# Patient Record
Sex: Male | Born: 1990 | Race: White | Hispanic: No | Marital: Single | Smoking: Former smoker
Health system: Southern US, Community
[De-identification: ages and names within clinical notes are randomized; demographics above are authoritative.]

## PROBLEM LIST (undated history)

## (undated) DIAGNOSIS — F329 Major depressive disorder, single episode, unspecified: Secondary | ICD-10-CM

## (undated) DIAGNOSIS — H905 Unspecified sensorineural hearing loss: Secondary | ICD-10-CM

## (undated) DIAGNOSIS — F419 Anxiety disorder, unspecified: Secondary | ICD-10-CM

## (undated) DIAGNOSIS — F32A Depression, unspecified: Secondary | ICD-10-CM

## (undated) DIAGNOSIS — F429 Obsessive-compulsive disorder, unspecified: Secondary | ICD-10-CM

## (undated) HISTORY — PX: EYE SURGERY: SHX253

## (undated) HISTORY — DX: Major depressive disorder, single episode, unspecified: F32.9

## (undated) HISTORY — DX: Obsessive-compulsive disorder, unspecified: F42.9

## (undated) HISTORY — DX: Anxiety disorder, unspecified: F41.9

## (undated) HISTORY — PX: BRAIN SURGERY: SHX531

## (undated) HISTORY — DX: Unspecified sensorineural hearing loss: H90.5

## (undated) HISTORY — DX: Depression, unspecified: F32.A

---
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
## 2017-04-07 ENCOUNTER — Ambulatory Visit (INDEPENDENT_AMBULATORY_CARE_PROVIDER_SITE_OTHER): Payer: BC Managed Care – PPO | Admitting: Orthopedic Surgery

## 2017-04-07 ENCOUNTER — Encounter (INDEPENDENT_AMBULATORY_CARE_PROVIDER_SITE_OTHER): Payer: Self-pay | Admitting: Family

## 2017-04-07 ENCOUNTER — Ambulatory Visit (INDEPENDENT_AMBULATORY_CARE_PROVIDER_SITE_OTHER): Payer: BC Managed Care – PPO

## 2017-04-07 VITALS — Ht 70.0 in | Wt 175.0 lb

## 2017-04-07 DIAGNOSIS — M7542 Impingement syndrome of left shoulder: Secondary | ICD-10-CM | POA: Insufficient documentation

## 2017-04-07 DIAGNOSIS — M542 Cervicalgia: Secondary | ICD-10-CM | POA: Insufficient documentation

## 2017-04-07 MED ORDER — METHYLPREDNISOLONE ACETATE 40 MG/ML IJ SUSP
40.0000 mg | INTRAMUSCULAR | Status: AC | PRN
  Administered 2017-04-07: 40 mg via INTRA_ARTICULAR

## 2017-04-07 MED ORDER — LIDOCAINE HCL 1 % IJ SOLN
5.0000 mL | INTRAMUSCULAR | Status: AC | PRN
  Administered 2017-04-07: 5 mL

## 2017-04-07 NOTE — Progress Notes (Signed)
Office Visit Note   Patient: Alexander SpiesDaniel P Hayes           Date of Birth: 10-01-1991           MRN: [id number] Visit Date: 04/07/2017              Requested by: Daisy Florooss, Charles Alan, MD [address] [place], [place] [address] PCP: Daisy Florooss, Charles Alan, MD  Chief Complaint  Patient presents with  . Neck - Pain      HPI: Patient is a 26 year old gentleman who presents with recurrent left shoulder pain. He states about 3-4 years ago he was told he had impingement syndrome he did physical therapy and his shoulder got better he had taken anti-inflammatories as well. Patient states at this time he resumed his physical therapy which has made the pain worse he states he has decreased range of motion the shoulder and decreased ability to sleep he states he's got left-sided neck pain with numbness and tingling down into his hand. He states he may have decreased grip strength..  Assessment & Plan: Visit Diagnoses:  1. Neck pain   2. Impingement syndrome of left shoulder     Plan: Patient's subacromial space of left shoulder was injected he tolerated this well follow-up in 4 weeks. Discussed that he does have some radicular symptoms but most of his symptoms seem to be coming from impingement of the shoulder. We'll reevaluate at follow-up. Patient cannot obtain an MRI scan due to his cochlear implant. May need a CT scan of the shoulder at follow-up if not better.  Follow-Up Instructions: Return in about 4 weeks (around 05/05/2017).   Ortho Exam  Patient is alert, oriented, no adenopathy, well-dressed, normal affect, normal respiratory effort. Examination patient has a normal gait. He has full range of motion left shoulder he has pain with Neer and Hawkins impingement test pain with drop arm test the biceps tendon is tender to palpation. The thoracic outlet is nontender to palpation that he does have some tenderness to palpation along the medial scapular border. There is no focal motor weakness in  either upper extremity. No pain with rotation of the cervical spine.  Imaging: Xr Cervical Spine 2 Or 3 Views  Result Date: 04/07/2017 Two-view radiographs of the cervical spine shows some small osteophytic bone spurs C5-6 otherwise the joint spaces are well maintained.   Labs: No results found for: HGBA1C, ESRSEDRATE, CRP, LABURIC, REPTSTATUS, GRAMSTAIN, CULT, LABORGA  Orders:  Orders Placed This Encounter  Procedures  . Large Joint Injection/Arthrocentesis  . XR Cervical Spine 2 or 3 views   No orders of the defined types were placed in this encounter.    Procedures: Large Joint Inj Date/Time: 04/07/2017 10:18 AM Performed by: Yishai Rehfeld V Authorized by: Nadara MustardUDA, Maiyah Goyne V   Consent Given by:  Patient Site marked: the procedure site was marked   Timeout: prior to procedure the correct patient, procedure, and site was verified   Indications:  Pain and diagnostic evaluation Location:  Shoulder Site:  L subacromial bursa Prep: patient was prepped and draped in usual sterile fashion   Needle Size:  22 G Needle Length:  1.5 inches Approach:  Posterior Ultrasound Guidance: No   Fluoroscopic Guidance: No   Arthrogram: No   Medications:  5 mL lidocaine 1 %; 40 mg methylPREDNISolone acetate 40 MG/ML Aspiration Attempted: No   Patient tolerance:  Patient tolerated the procedure well with no immediate complications    Clinical Data: No additional findings.  ROS:  All other systems negative, except as noted in the HPI. Review of Systems  Objective: Vital Signs: Ht 5\' 10"  (1.778 m)   Wt 175 lb (79.4 kg)   BMI 25.11 kg/m   Specialty Comments:  No specialty comments available.  PMFS History: Patient Active Problem List   Diagnosis Date Noted  . Impingement syndrome of left shoulder 04/07/2017  . Neck pain 04/07/2017   No past medical history on file.  No family history on file.  No past surgical history on file. Social History   Occupational History  . Not on  file.   Social History Main Topics  . Smoking status: Never Smoker  . Smokeless tobacco: Never Used  . Alcohol use No  . Drug use: No  . Sexual activity: Not on file

## 2017-05-05 ENCOUNTER — Telehealth (INDEPENDENT_AMBULATORY_CARE_PROVIDER_SITE_OTHER): Payer: Self-pay | Admitting: Family

## 2017-05-05 ENCOUNTER — Ambulatory Visit (INDEPENDENT_AMBULATORY_CARE_PROVIDER_SITE_OTHER): Payer: BC Managed Care – PPO | Admitting: Family

## 2017-05-05 ENCOUNTER — Ambulatory Visit
Admission: RE | Admit: 2017-05-05 | Discharge: 2017-05-05 | Disposition: A | Discharge status: Home / Self Care | Payer: BC Managed Care – PPO | Source: Ambulatory Visit | Attending: Family | Admitting: Family

## 2017-05-05 DIAGNOSIS — M7542 Impingement syndrome of left shoulder: Secondary | ICD-10-CM

## 2017-05-05 NOTE — Progress Notes (Signed)
   Office Visit Note   Patient: Alexander Hayes           Date of Birth: 03-Oct-1991           MRN: [id number] Visit Date: 05/05/2017              Requested by: Daisy Florooss, Charles Alan, MD [address] [place], [place] [address] PCP: Daisy Florooss, Charles Alan, MD  No chief complaint on file.     HPI: Patient is a 26 year old gentleman who presents with recurrent left shoulder pain. He states about 3-4 years ago he was told he had impingement syndrome he did physical therapy and his shoulder got better he had taken anti-inflammatories as well. Patient states 4 weeks ago had resumed his physical therapy which has made the pain worse he states he has decreased range of motion the shoulder and decreased ability to sleep. No neck pain today. Does have some numbness in upper arm. No tingling today.   Did have subacromial depomedrol injection 4 weeks ago. Has noticed no relief with this injection.   Assessment & Plan: Visit Diagnoses:  1. Impingement syndrome of left shoulder     Plan: Will proceed with a CT scan of the shoulder. Follow up for review of CT. Discussed possibility of arthroscopy.   Follow-Up Instructions: Return for ct review.   Ortho Exam  Patient is alert, oriented, no adenopathy, well-dressed, normal affect, normal respiratory effort. Examination patient has a normal gait. He has full range of motion left shoulder he has pain with Neer and Hawkins impingement test. pain with drop arm test. the biceps tendon is tender to palpation. he does have some tenderness to palpation along the medial scapular border. There is no focal motor weakness in either upper extremity. No pain with rotation of the cervical spine.  Imaging: No results found.  Labs: No results found for: HGBA1C, ESRSEDRATE, CRP, LABURIC, REPTSTATUS, GRAMSTAIN, CULT, LABORGA  Orders:  Orders Placed This Encounter  Procedures  . CT SHOULDER LEFT WO CONTRAST   No orders of the defined types were placed in this  encounter.    Procedures: No procedures performed  Clinical Data: No additional findings.  ROS:  All other systems negative, except as noted in the HPI. Review of Systems  Constitutional: Negative for chills and fever.  Musculoskeletal: Positive for arthralgias.    Objective: Vital Signs: There were no vitals taken for this visit.  Specialty Comments:  No specialty comments available.  PMFS History: Patient Active Problem List   Diagnosis Date Noted  . Impingement syndrome of left shoulder 04/07/2017  . Neck pain 04/07/2017   No past medical history on file.  No family history on file.  No past surgical history on file. Social History   Occupational History  . Not on file.   Social History Main Topics  . Smoking status: Never Smoker  . Smokeless tobacco: Never Used  . Alcohol use No  . Drug use: No  . Sexual activity: Not on file

## 2017-05-05 NOTE — Telephone Encounter (Signed)
error 

## 2017-05-09 ENCOUNTER — Telehealth (INDEPENDENT_AMBULATORY_CARE_PROVIDER_SITE_OTHER): Payer: Self-pay | Admitting: Family

## 2017-05-10 ENCOUNTER — Telehealth (INDEPENDENT_AMBULATORY_CARE_PROVIDER_SITE_OTHER): Payer: Self-pay | Admitting: Orthopedic Surgery

## 2017-05-10 NOTE — Telephone Encounter (Signed)
Patient would be out of work for 2 weeks after surgery and then light duty work with no lifting greater than 5 pounds for 2 more weeks.

## 2017-05-10 NOTE — Telephone Encounter (Signed)
WHILE SCHEDULING PT FOR SURGERY HE HAD A QUESTION OF HOW LONG TO EXPECT TO BE OUT OF WORK SO THAT HE CAN PUT THE REQUEST IN TO HIS JOB. PLEASE GIVE PT A CALL.

## 2017-05-11 NOTE — Telephone Encounter (Signed)
I called and spoke with patient to advise of message below. He is actually wanting to cancel on 26th. He feels he is making improvement.

## 2017-05-30 ENCOUNTER — Inpatient Hospital Stay (INDEPENDENT_AMBULATORY_CARE_PROVIDER_SITE_OTHER): Payer: BC Managed Care – PPO | Admitting: Orthopedic Surgery

## 2017-07-13 NOTE — Telephone Encounter (Signed)
ERROR

## 2018-05-01 IMAGING — CT CT SHOULDER*L* W/O CM
2 series · 15 of 20 positions shown, 18 images · non-contrast
Comparison: None.

CLINICAL DATA: Left shoulder pain, limited range of motion for 3
years.

EXAM:
CT OF THE UPPER LEFT EXTREMITY WITHOUT CONTRAST
TECHNIQUE: Multidetector CT imaging of the upper left extremity was performed
according to the standard protocol.

[Series 2: soft tissue · axial · 0.49mm/px · z∈[-616,-450]mm · 12 of 99 slices shown, 15 images]
[im 8/99  soft-tissue]
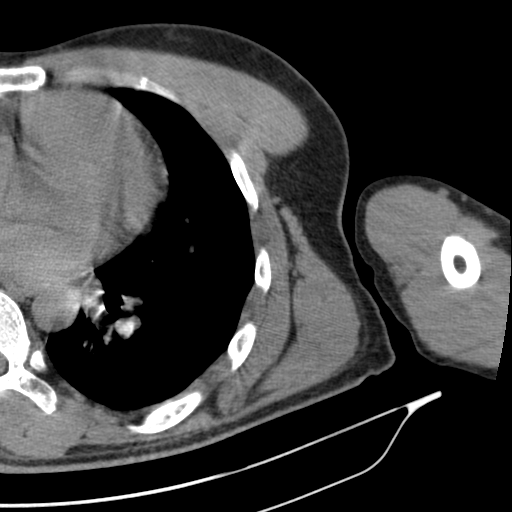
[im 8/99  bone]
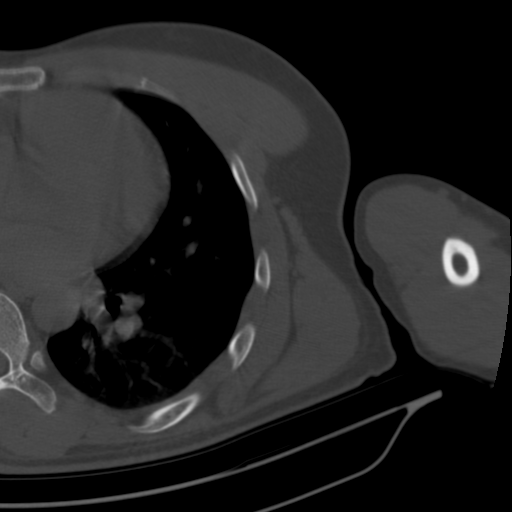
[im 16/99  bone]
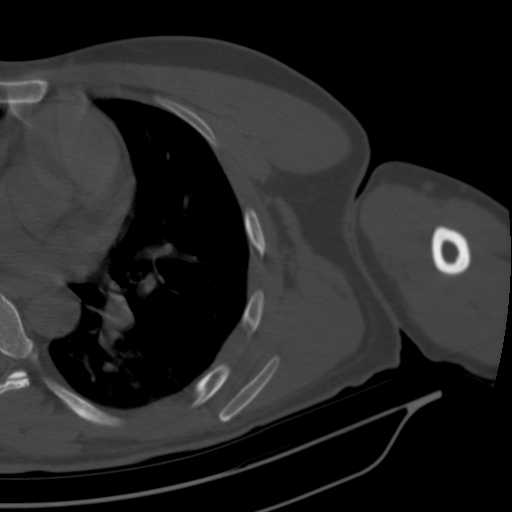
[im 23/99  bone]
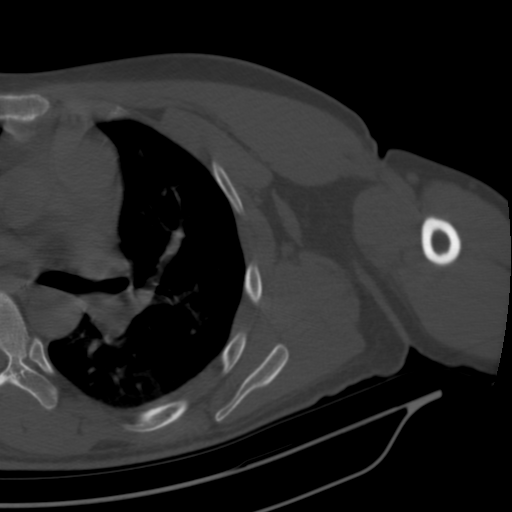
[im 31/99  bone]
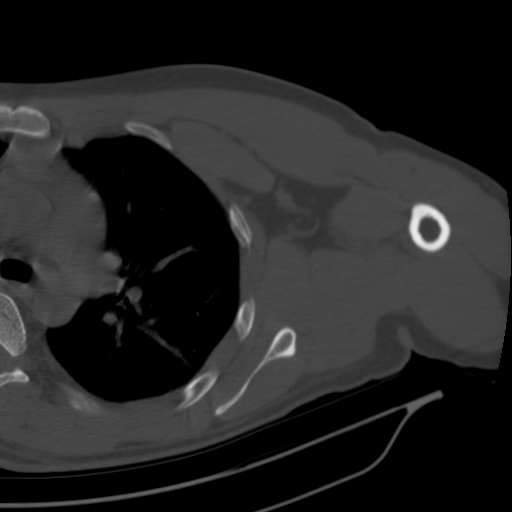
[im 38/99  soft-tissue]
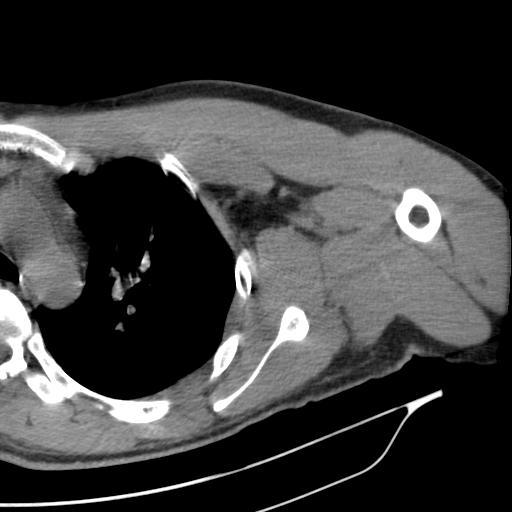
[im 38/99  bone]
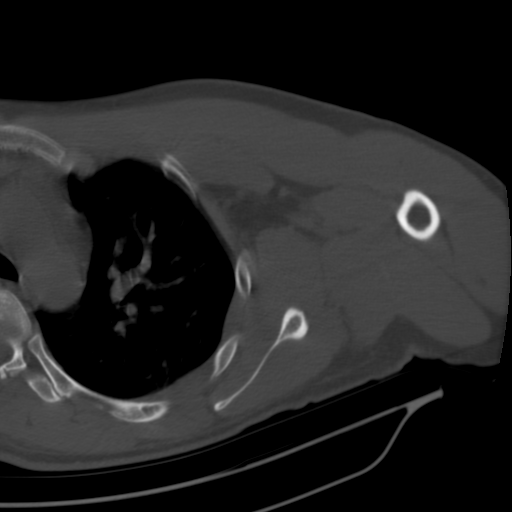
[im 46/99  bone]
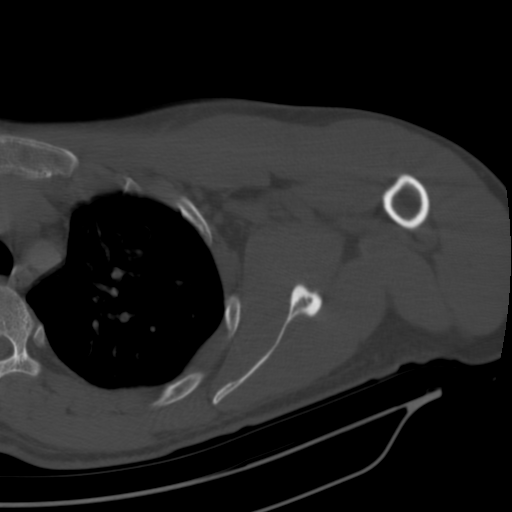
[im 53/99  bone]
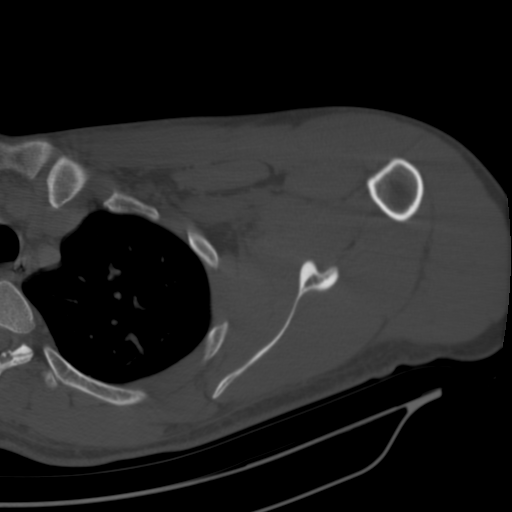
[im 61/99  bone]
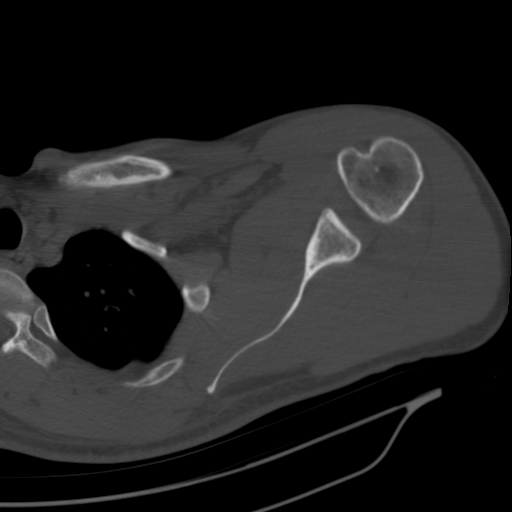
[im 68/99  soft-tissue]
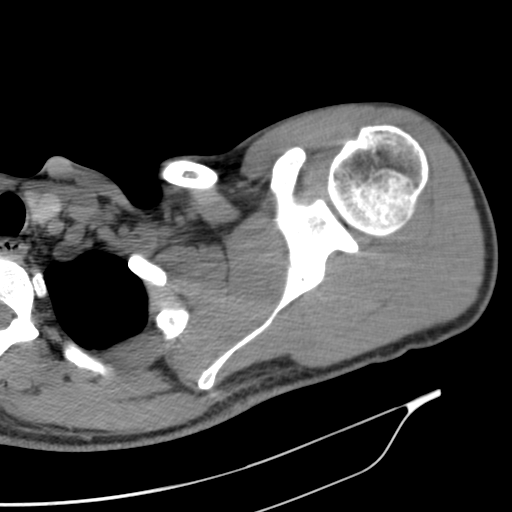
[im 68/99  bone]
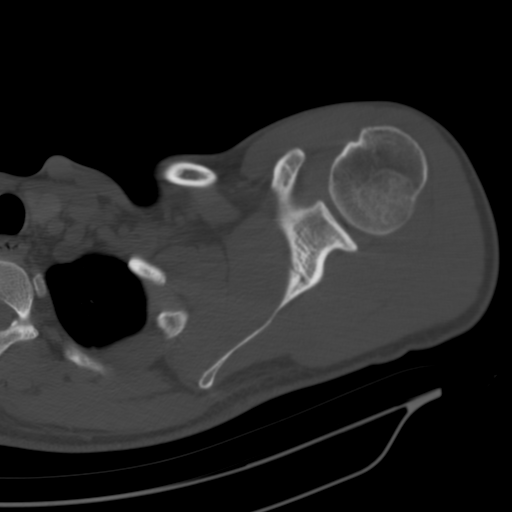
[im 76/99  bone]
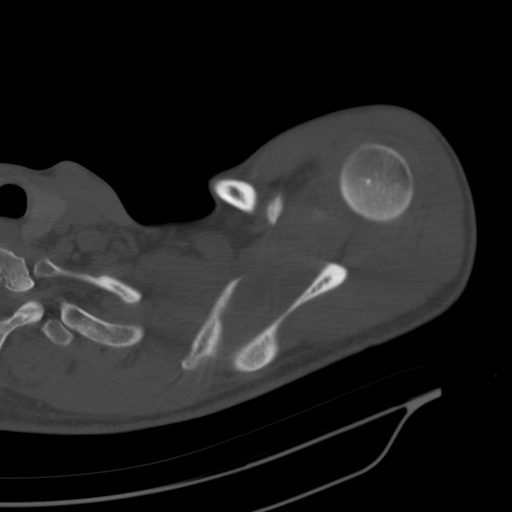
[im 83/99  bone]
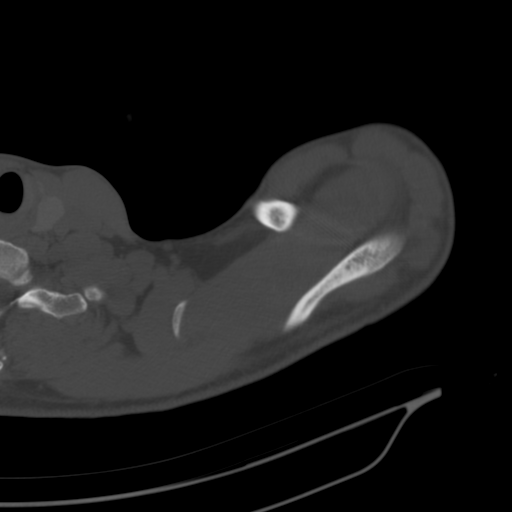
[im 91/99  bone]
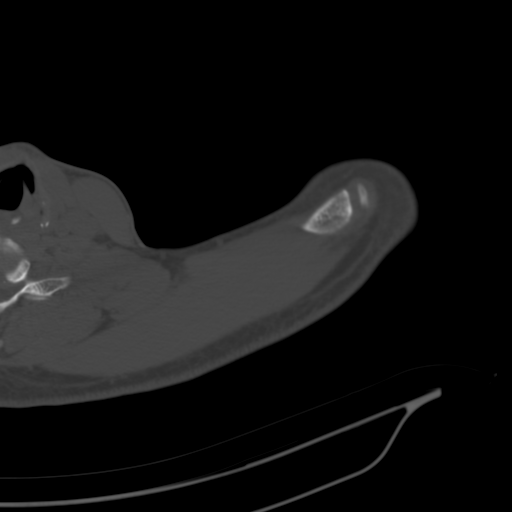

[Series 7: cor soft · coronal · 0.41mm/px · 3 of 87 slices shown]
[im 18/87  bone]
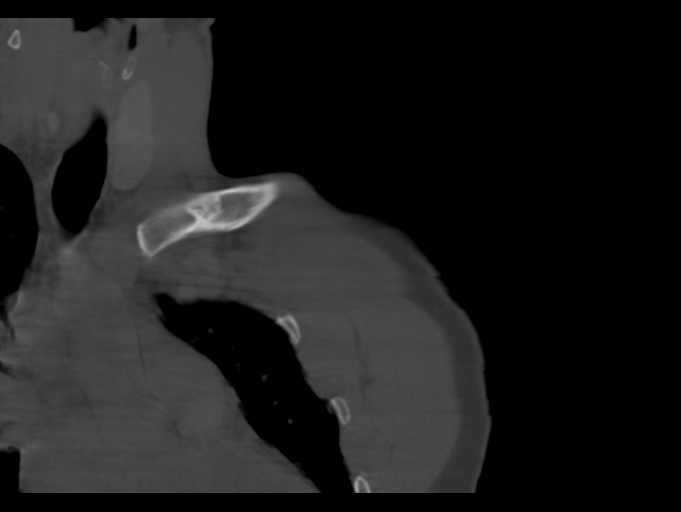
[im 35/87  bone]
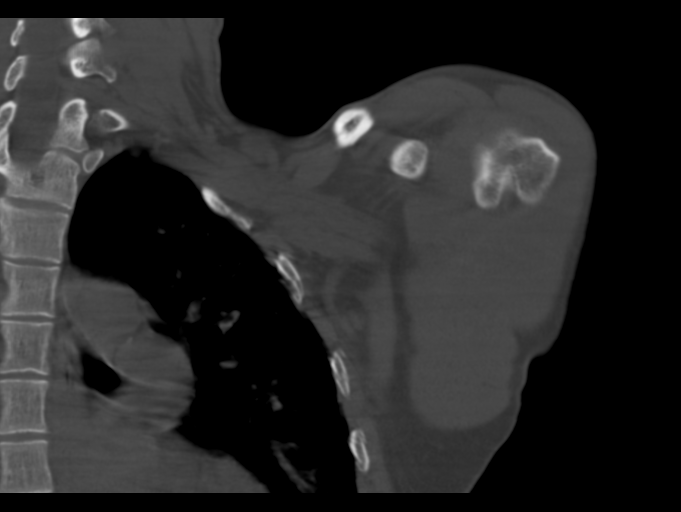
[im 52/87  bone]
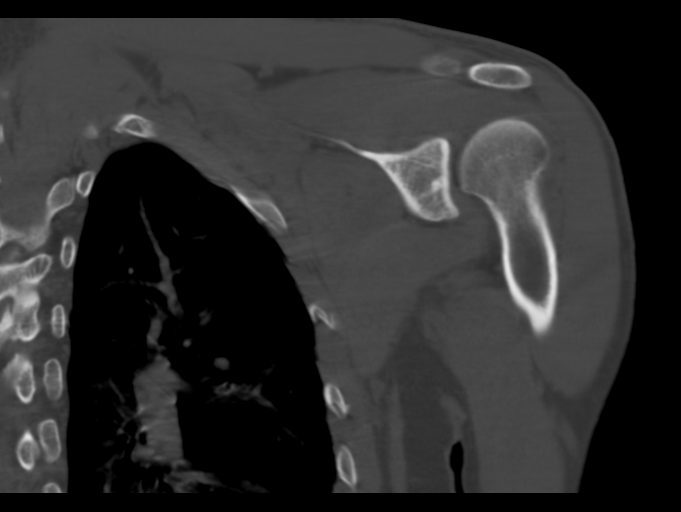

[15 of 20 positions shown; findings below may reference images not displayed]

FINDINGS: Bones/Joint/Cartilage

No fracture or dislocation. Normal alignment. No joint effusion.
Normal glenohumeral joint. Normal acromioclavicular joint.

Small sclerotic bone lesion in the glenoid consistent with a bone
island. No aggressive lytic or sclerotic osseous lesion.

Ligaments

Ligaments are suboptimally evaluated by CT.

Muscles and Tendons
Muscles are normal.  No muscle atrophy.

Soft tissue
No fluid collection or hematoma.  No soft tissue mass.
IMPRESSION: No acute osseous injury of the left shoulder. Evaluation of the
rotator cuff is limited on this examination given the lack of
intra-articular contrast. If there is persisting concern regarding a
rotator cuff tear, recommend an MRI of the shoulder or CT arthrogram
of the shoulder if the patient is unable to have an MRI.

## 2018-08-28 ENCOUNTER — Ambulatory Visit (INDEPENDENT_AMBULATORY_CARE_PROVIDER_SITE_OTHER): Payer: BC Managed Care – PPO | Admitting: Psychiatry

## 2018-08-28 DIAGNOSIS — F422 Mixed obsessional thoughts and acts: Secondary | ICD-10-CM | POA: Diagnosis not present

## 2018-08-28 DIAGNOSIS — F331 Major depressive disorder, recurrent, moderate: Secondary | ICD-10-CM | POA: Diagnosis not present

## 2018-08-28 DIAGNOSIS — F411 Generalized anxiety disorder: Secondary | ICD-10-CM

## 2018-08-28 NOTE — Progress Notes (Signed)
[facility] Med Check  Patient ID: Alexander Hayes,  MRN: [id number]  PCP: Daisy Floro, MD  Date of Evaluation: 08/30/2018 Time spent:20 minutes   HISTORY/CURRENT STATUS: 27 yowmale seen 1 months ago.better and increased cymbalta to 90 mg day. SI and SP in past. Declined use of lithium  Currently Patient is currently doing about the same. Individual Medical History/ Review of Systems: Changes? No   Allergies: Patient has no known allergies.  Current Medications:  Current Outpatient Medications:  .  DULoxetine (CYMBALTA) 30 MG capsule, Take 30 mg by mouth 3 (three) times daily. 3/d, Disp: , Rfl:  Medication Side Effects: None  Family Medical/ Social History: Changes? No  MENTAL HEALTH EXAM:  There were no vitals taken for this visit.There is no height or weight on file to calculate BMI.  General Appearance: Casual  Eye Contact:  Good  Speech:  Normal Rate  Volume:  Normal  Mood:  Euthymic  Affect:  Appropriate  Thought Process:  Coherent  Orientation:  Full (Time, Place, and Person)  Thought Content: Logical   Suicidal Thoughts:  No  Homicidal Thoughts:  No  Memory:  Immediate  Judgement:  Good  Insight:  Good  Psychomotor Activity:  Normal  Concentration:  Concentration: Good  Recall:  Good  Fund of Knowledge: Good  Language: Good  Akathisia:  NA  AIMS (if indicated): na   Assets:  Desire for Improvement  ADL's:  Intact  Cognition: WNL  Prognosis:  Good    DIAGNOSES:    ICD-10-CM   1. Major depressive disorder, recurrent episode, moderate (HCC) F33.1   2. Generalized anxiety disorder F41.1   3. Mixed obsessional thoughts and acts F42.2     RECOMMENDATIONS: No change in his treatment plan.  He is to return in 2 months.    Anne Fu, PA-C

## 2018-09-02 DIAGNOSIS — F411 Generalized anxiety disorder: Secondary | ICD-10-CM

## 2018-09-02 DIAGNOSIS — F341 Dysthymic disorder: Secondary | ICD-10-CM | POA: Insufficient documentation

## 2018-09-12 ENCOUNTER — Ambulatory Visit: Payer: Self-pay | Admitting: Psychiatry

## 2018-09-13 ENCOUNTER — Ambulatory Visit: Payer: Self-pay | Admitting: Psychiatry

## 2018-09-17 ENCOUNTER — Other Ambulatory Visit: Payer: Self-pay | Admitting: Psychiatry

## 2019-01-28 ENCOUNTER — Ambulatory Visit: Payer: BC Managed Care – PPO | Admitting: Physician Assistant

## 2019-02-19 ENCOUNTER — Ambulatory Visit: Payer: BC Managed Care – PPO | Admitting: Physician Assistant

## 2019-02-21 ENCOUNTER — Ambulatory Visit (INDEPENDENT_AMBULATORY_CARE_PROVIDER_SITE_OTHER): Payer: BC Managed Care – PPO | Admitting: Psychiatry

## 2019-02-21 ENCOUNTER — Encounter: Payer: Self-pay | Admitting: Psychiatry

## 2019-02-21 ENCOUNTER — Other Ambulatory Visit: Payer: Self-pay

## 2019-02-21 DIAGNOSIS — F411 Generalized anxiety disorder: Secondary | ICD-10-CM | POA: Diagnosis not present

## 2019-02-21 DIAGNOSIS — F341 Dysthymic disorder: Secondary | ICD-10-CM

## 2019-02-21 DIAGNOSIS — F422 Mixed obsessional thoughts and acts: Secondary | ICD-10-CM | POA: Diagnosis not present

## 2019-02-21 DIAGNOSIS — F429 Obsessive-compulsive disorder, unspecified: Secondary | ICD-10-CM | POA: Insufficient documentation

## 2019-02-21 MED ORDER — BUPROPION HCL ER (XL) 150 MG PO TB24: 150.0000 mg | ORAL_TABLET | Freq: Every day | ORAL | 1 refills | Status: DC

## 2019-02-21 MED ORDER — FLUVOXAMINE MALEATE 100 MG PO TABS: 100.0000 mg | ORAL_TABLET | Freq: Every day | ORAL | 1 refills | Status: DC
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
## 2019-02-28 ENCOUNTER — Telehealth: Payer: Self-pay | Admitting: Psychiatry

## 2019-02-28 DIAGNOSIS — F411 Generalized anxiety disorder: Secondary | ICD-10-CM

## 2019-02-28 DIAGNOSIS — F422 Mixed obsessional thoughts and acts: Secondary | ICD-10-CM

## 2019-02-28 DIAGNOSIS — F341 Dysthymic disorder: Secondary | ICD-10-CM

## 2019-02-28 NOTE — Telephone Encounter (Signed)
Patient called and said that he is having side effects from the medicine. He is having [place] hand tremors. He had to leave work early because he can't finish his work. Please call him and let him know what to do. His next appt is in may [phone]

## 2019-02-28 NOTE — Telephone Encounter (Signed)
Patient phones that he started Wellbutrin 150 XL in the morning then adding the Luvox at night at 100 mg nightly after which he seemed to develop nausea and hand tremor for which he left work today.  He reports being more anxious whether from the side effect or the absence of benefit, though acknowledging he is frustrated as he wanted more relief and instead finds problems.  He recalls that Zoloft caused no nausea whereas Luvox in the same family may be causing his nausea.  He agrees to stop the Luvox and assess symptom course for the next 1 to 2 days after which if symptoms persist he can stop the Wellbutrin for 1 to 2 days and then  contact me Monday morning to plan treatment according to the outcome of those adjustments.  He is less frustrated and more satisfied with the plan by the end of the discussion, though he requires 10 minutes of discussion to feel relief.

## 2019-03-05 MED ORDER — SERTRALINE HCL 100 MG PO TABS: 100.0000 mg | ORAL_TABLET | Freq: Every day | ORAL | 1 refills | Status: DC

## 2019-03-05 NOTE — Telephone Encounter (Signed)
Patient now phones that he has stopped Wellbutrin and Luvox considering them too potent for him causing side effects.  He recalls the Zoloft being his best medication in the past except for episodic diarrhea once a week.  We will restart Zoloft nightly last taken approximately 6 months ago not having discontinuation symptoms now.  Zoloft 100 mg every morning after breakfast #30 with 1 refill is [facility] on [address] and [address] to titrate up from 1/2 tablet over the first 10 days to 1 tablet thereafter.

## 2019-03-05 NOTE — Telephone Encounter (Signed)
Pt wishes to go back on Zoloft. He has stopped Luvox and Wellbutrin since Thursday.

## 2019-03-15 ENCOUNTER — Ambulatory Visit: Payer: BC Managed Care – PPO | Admitting: Physician Assistant

## 2019-03-27 ENCOUNTER — Other Ambulatory Visit: Payer: Self-pay | Admitting: Psychiatry

## 2019-03-27 DIAGNOSIS — F422 Mixed obsessional thoughts and acts: Secondary | ICD-10-CM

## 2019-03-27 DIAGNOSIS — F411 Generalized anxiety disorder: Secondary | ICD-10-CM

## 2019-03-27 DIAGNOSIS — F341 Dysthymic disorder: Secondary | ICD-10-CM

## 2019-04-04 ENCOUNTER — Encounter: Payer: Self-pay | Admitting: Psychiatry

## 2019-04-04 ENCOUNTER — Other Ambulatory Visit: Payer: Self-pay

## 2019-04-04 ENCOUNTER — Ambulatory Visit (INDEPENDENT_AMBULATORY_CARE_PROVIDER_SITE_OTHER): Payer: BC Managed Care – PPO | Admitting: Psychiatry

## 2019-04-04 DIAGNOSIS — F422 Mixed obsessional thoughts and acts: Secondary | ICD-10-CM | POA: Diagnosis not present

## 2019-04-04 DIAGNOSIS — F341 Dysthymic disorder: Secondary | ICD-10-CM | POA: Diagnosis not present

## 2019-04-04 DIAGNOSIS — F411 Generalized anxiety disorder: Secondary | ICD-10-CM

## 2019-04-04 MED ORDER — SERTRALINE HCL 100 MG PO TABS: 150.0000 mg | ORAL_TABLET | Freq: Every day | ORAL | 0 refills | Status: DC

## 2019-04-04 NOTE — Progress Notes (Signed)
[facility]  Patient ID: Alexander Hayes,  MRN: [id number]  PCP: Daisy Floro, MD  Date of Evaluation: 04/04/2019 Time spent:20 minutes from 1400 to 1420  Chief Complaint:  Chief Complaint    Depression; Anxiety; Paranoid      HISTORY/CURRENT STATUS: Alexander Hayes is provided telemedicine audiovisual appointment session, declining video camera as he is walking in the park distracted by the wind altering cochlear implants as relative resistance to session, with consent not collateral for psychiatric interview and exam in 6-week evaluation and management of dysthymia, OCD, and GAD comorbid with compensation for hearing impairment.  Patient sought restructured medication management last appointment with chief concern that providers had been too conservative after 6 months of therapy had been ineffective.  However he phoned 1 week after starting low-dose Wellbutrin and Luvox stating that he must change back to Zoloft of the past which other than diarrhea had few side effects.  He now gives the curious answer that he had mild side effects from Zoloft for a week and then these resolved when he did not extend similar opportunity with with the Luvox and Wellbutrin after previous lithium, Paxil, and Cymbalta.  He then surprisingly states he is looking for a new psychotherapist as though he will be less dependent on medication efficacy.  [name] reports having more physical and emotional energy with improved mood by these decisive changes, using modest alcohol and no THC or tobacco now. He has no psychosis, mania, suicidality, or dissociation..  Depression       The patient presents with depression.  This is a chronic problem.  The current episode started more than 1 year ago.   The onset quality is gradual.   The problem occurs every several days.  The problem has been gradually improving since onset.  Associated symptoms include decreased concentration, helplessness, hopelessness, insomnia,  decreased interest, indigestion and sad.  Associated symptoms include no fatigue, not irritable, no restlessness, no appetite change, no body aches, no myalgias, no headaches and no suicidal ideas.     The symptoms are aggravated by work stress, social issues, family issues and medication.  Past treatments include SSRIs - Selective serotonin reuptake inhibitors, SNRIs - Serotonin and norepinephrine reuptake inhibitors, other medications and psychotherapy.  Compliance with treatment is variable.  Past compliance problems include difficulty with treatment plan, medication issues and medical issues.  Previous treatment provided mild relief.  Risk factors include a change in medication usage/dosage, alcohol intake, family history of mental illness, family history, history of mental illness, major life event, prior psychiatric admission, stress and prior traumatic experience.   Past medical history includes chronic illness, physical disability, brain trauma, anxiety, depression, mental health disorder and obsessive-compulsive disorder.     Pertinent negatives include no life-threatening condition, no recent psychiatric admission, no bipolar disorder, no eating disorder, no post-traumatic stress disorder, no schizophrenia, no suicide attempts and no head trauma.   Individual Medical History/ Review of Systems: Changes? :No   Allergies: Patient has no known allergies.  Current Medications:  Current Outpatient Medications:  .  sertraline (ZOLOFT) 100 MG tablet, Take 1.5 tablets (150 mg total) by mouth daily., Disp: 135 tablet, Rfl: 0   Medication Side Effects: none after 1 week of mild GI and craniofacial dysesthesia from Zoloft that resolved when he expected diarrhea from the past but had none.  He has hand tremor and nausea from Luvox and anxiety from Wellbutrin.  Family Medical/ Social History: Changes? Yes, modest alcohol but no THC or tobacco  now.  MENTAL HEALTH EXAM:  There were no vitals taken for  this visit.There is no height or weight on file to calculate BMI.  as not present here today  General Appearance: N/A  Eye Contact:  N/A  Speech:  Garbled and Talkative  Volume:  Increased  Mood:  Anxious, Dysphoric, Euthymic and Irritable  Affect:  Constricted, Inappropriate and Anxious  Thought Process:  Goal Directed, Irrelevant and Linear  Orientation:  Full (Time, Place, and Person)  Thought Content: Obsessions, Paranoid Ideation and Rumination   Suicidal Thoughts:  No  Homicidal Thoughts:  No  Memory:  Immediate;   Good Remote;   Fair  Judgement:  Impaired  Insight:  Fair and Lacking  Psychomotor Activity:  Increased, Decreased and Mannerisms  Concentration:  Concentration: Fair and Attention Span: Fair  Recall:  FiservFair  Fund of Knowledge: Fair  Language: Poor  Assets:  Desire for Improvement Leisure Time Talents/Skills  ADL's:  Intact  Cognition: WNL  Prognosis:  Fair    DIAGNOSES:    ICD-10-CM   1. Persistent depressive disorder with anxious distress, currently moderate F34.1 sertraline (ZOLOFT) 100 MG tablet  2. Mixed obsessional thoughts and acts F42.2 sertraline (ZOLOFT) 100 MG tablet  3. Generalized anxiety disorder F41.1 sertraline (ZOLOFT) 100 MG tablet    Receiving Psychotherapy: Yes Terminated with Angela Burkeawyna Thompson, LPC for 6 months in 2016 considering starting again looking for a therapist reporting specific options.   RECOMMENDATIONS: Over 50% of the session is spent in counseling and coordination of care attempting to secure for patient problem solving style that does not become circular or self-defeating but can build stepwise solutions for improved function.  Patient can continue this work in therapy if he starts.  At this time he gradually acknowledges that he has only partial benefit from 100 mg of Zoloft daily and agrees that absence of any side effects now favors increase especially for his OCD to dose 1-1/2 of the 100 mg tablets total 150 mg sent as  90-day supply #135 [facility] [address] with no refill for OCD, dysthymia and GAD.  He returns for follow up in 4 weeks, but he may possibly require low-dose Trilafon or BuSpar to sustan improvement.  Virtual Visit via Video Note  I connected with Melton AlarDaniel P Sheeley on 04/04/19 at  2:00 PM EDT by a video enabled telemedicine application and verified that I am speaking with the correct person using two identifiers.  Location: Patient: Individually walking in the park instead of staying home for his appointment Provider: [facility]   I discussed the limitations of evaluation and management by telemedicine and the availability of in person appointments. The patient expressed understanding and agreed to proceed.  History of Present Illness: 6-week evaluation and management address dysthymia, OCD, and GAD comorbid with compensation for hearing impairment.  Patient sought restructured medication management last appointment with chief concern that providers had been too conservative after 6 months of therapy had been ineffective.  However he phoned 1 week after starting low-dose Wellbutrin and Luvox stating that he must change back to Zoloft of the past which other than diarrhea had few side effects.    Observations/Objective: Mood:  Anxious, Dysphoric, Euthymic and Irritable  Affect:  Constricted, Inappropriate and Anxious  Thought Process:  Goal Directed, Irrelevant and Linear  Orientation:  Full (Time, Place, and Person)  Thought Content: Obsessions, Paranoid Ideation and Rumination    Assessment and Plan: Over 50% of the session is spent  in counseling and coordination of care attempting to secure for patient problem solving style that does not become circular or self-defeating but can build stepwise solutions for improved function.  Patient can continue this work in therapy if he starts.  At this time he gradually acknowledges that he has only partial benefit from  100 mg of Zoloft daily and agrees that absence of any side effects now favors increase especially for his OCD to dose 1-1/2 of the 100 mg tablets total 150 mg sent as 90-day supply #135 [facility] [address] with no refill for OCD, dysthymia and GAD.  Follow Up Instructions: He returns for follow up in 4 weeks, but he may possibly require low-dose Trilafon or BuSpar to sustan improvement.    I discussed the assessment and treatment plan with the patient. The patient was provided an opportunity to ask questions and all were answered. The patient agreed with the plan and demonstrated an understanding of the instructions.   The patient was advised to call back or seek an in-person evaluation if the symptoms worsen or if the condition fails to improve as anticipated.  I provided 20 minutes of non-face-to-face time during this encounter. [place] WebEx meeting #[id number] Password: [email or web address]  Chauncey Mann, MD  Chauncey Mann, MD

## 2019-06-30 ENCOUNTER — Other Ambulatory Visit: Payer: Self-pay | Admitting: Psychiatry

## 2019-06-30 DIAGNOSIS — F341 Dysthymic disorder: Secondary | ICD-10-CM

## 2019-06-30 DIAGNOSIS — F422 Mixed obsessional thoughts and acts: Secondary | ICD-10-CM

## 2019-06-30 DIAGNOSIS — F411 Generalized anxiety disorder: Secondary | ICD-10-CM

## 2019-07-01 NOTE — Telephone Encounter (Signed)
Patient seen twice on March 26 and May 7 initially seeking an updated medication regimen but then returning to his usual longstanding sertraline 100 mg taking 1-1/2 tablets total 150 mg daily sent as a 90-day supply on May 7 now requesting refill needing to continue but reminded overdue for follow-up.

## 2019-07-01 NOTE — Telephone Encounter (Signed)
Last visit 04/04/2019, hasn't had 4 week follow up

## 2019-09-25 ENCOUNTER — Other Ambulatory Visit: Payer: Self-pay

## 2019-09-25 ENCOUNTER — Ambulatory Visit (INDEPENDENT_AMBULATORY_CARE_PROVIDER_SITE_OTHER): Payer: BC Managed Care – PPO | Admitting: Psychiatry

## 2019-09-25 ENCOUNTER — Encounter: Payer: Self-pay | Admitting: Psychiatry

## 2019-09-25 DIAGNOSIS — F411 Generalized anxiety disorder: Secondary | ICD-10-CM | POA: Diagnosis not present

## 2019-09-25 DIAGNOSIS — F341 Dysthymic disorder: Secondary | ICD-10-CM

## 2019-09-25 DIAGNOSIS — F422 Mixed obsessional thoughts and acts: Secondary | ICD-10-CM

## 2019-09-25 MED ORDER — SERTRALINE HCL 100 MG PO TABS: ORAL_TABLET | ORAL | 0 refills | Status: DC

## 2019-09-25 NOTE — Progress Notes (Deleted)
[facility] MD/PA/NP Initial Note  09/25/2019 4:16 PM Alexander Hayes  MRN:  [id number]  Chief Complaint:  Chief Complaint    Depression; Anxiety; Stress      HPI: ***  Visit Diagnosis:    ICD-10-CM   1. Persistent depressive disorder with anxious distress, currently mild  F34.1   2. Mixed obsessional thoughts and acts  F42.2 sertraline (ZOLOFT) 100 MG tablet  3. Generalized anxiety disorder  F41.1 sertraline (ZOLOFT) 100 MG tablet  4. Persistent depressive disorder with anxious distress, currently moderate  F34.1 sertraline (ZOLOFT) 100 MG tablet    Past Psychiatric History:   Past Medical History:  Past Medical History:  Diagnosis Date  . Anxiety   . Deafness congenital   . Depression   . Obsessive-compulsive disorder     Past Surgical History:  Procedure Laterality Date  . BRAIN SURGERY Right 1995, 2001   Cochlear implant  . EYE SURGERY Left     Family Psychiatric History: ***  Family History:  Family History  Problem Relation Age of Onset  . Depression Paternal Grandmother     Social History:  Social History   Socioeconomic History  . Marital status: Single    Spouse name: Not on file  . Number of children: Not on file  . Years of education: Not on file  . Highest education level: Bachelor's degree (e.g., BA, AB, BS)  Occupational History  . Not on file  Social Needs  . Financial resource strain: Not hard at all  . Food insecurity    Worry: Never true    Inability: Never true  . Transportation needs    Medical: No    Non-medical: No  Tobacco Use  . Smoking status: Former Research scientist (life sciences)  . Smokeless tobacco: Never Used  Substance and Sexual Activity  . Alcohol use: Yes  . Drug use: Not Currently    Types: Marijuana, Psilocybin, Amphetamines  . Sexual activity: Not on file  Lifestyle  . Physical activity    Days per week: Not on file    Minutes per session: Not on file  . Stress: Rather much  Relationships  . Social Herbalist on  phone: Not on file    Gets together: Not on file    Attends religious service: Not on file    Active member of club or organization: Not on file    Attends meetings of clubs or organizations: Not on file    Relationship status: Not on file  Other Topics Concern  . 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  Recent girlfriend last year broke up last fall.  He obsesses about whether he was appropriate in buying his own house and caring for his dog.  He has conflicts with father who readily disapproves of the patient's style of decision making and future planning.    Allergies: No Known Allergies  Metabolic Disorder Labs: No results found for: HGBA1C, MPG No results found for: PROLACTIN No results found for: CHOL, TRIG, HDL, CHOLHDL, VLDL, LDLCALC No results found for: TSH  Therapeutic Level Labs: No results found for: LITHIUM No results found for: VALPROATE No components found for:  CBMZ  Current Medications: Current Outpatient Medications  Medication Sig Dispense Refill  . sertraline (ZOLOFT) 100 MG tablet TAKE 1.5 TABLETS BY MOUTH DAILY  AFTER BREAKFAST 135 tablet 0   No current facility-administered medications for this visit.     Medication Side Effects: {Medication Side Effects (Optional):12147}  Orders placed this visit:  No orders of the defined types were placed in this encounter.   Psychiatric Specialty Exam:  ROS  There were no vitals taken for this visit.There is no height or weight on file to calculate BMI.  General Appearance: {PSY:[phone]}  Eye Contact:  {PSY:22684}  Speech:  {[phone]}  Volume:  {PSY:22686}  Mood:  {PSY:22306}  Affect:  {PSY:567-471-2960}  Thought Process:  {PSY:22688}  Orientation:  {PSY:22689}  Thought Content: {PSY:22690}   Suicidal  Thoughts:  {PSY:22692}  Homicidal Thoughts:  {PSY:22692}  Memory:  {PSY:812-481-7659}  Judgement:  {PSY:22694}  Insight:  {PSY:22695}  Psychomotor Activity:  {PSY:22696}  Concentration:  {PSY:21399}  Recall:  {PSY:22877}  Fund of Knowledge: {PSY:22877}  Language: {RFF:63846}  Assets:  {PSY:22698}  ADL's:  {PSY:22290}  Cognition: {KZL:[id number]}  Prognosis:  {PSY:22877}   Screenings:   Receiving Psychotherapy: {TJQ:30092}  Treatment Plan/Recommendations: ***  Virtual Visit via Video Note  I connected with Alexander Hayes on 09/25/19 at  3:40 PM EDT by a video enabled telemedicine application and verified that I am speaking with the correct person using two identifiers.  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 The patient expressed understanding and agreed to proceed.  History of Present Illness:    Observations/Objective:   Assessment and Plan:   Follow Up Instructions:    I discussed the assessment and treatment plan with the patient. The patient was provided an opportunity to ask questions and all were answered. The patient agreed with the plan and demonstrated an understanding of the instructions.   The patient was advised to call back or seek an in-person evaluation if the symptoms worsen or if the condition fails to improve as anticipated.  I provided 10 minutes of non-face-to-face time during this encounter. [employer] meeting #[id number] Meeting password:  [email or web address] [phone]  Chauncey Mann, MD   Chauncey Mann, MD

## 2019-09-30 ENCOUNTER — Other Ambulatory Visit: Payer: Self-pay | Admitting: Psychiatry

## 2019-09-30 DIAGNOSIS — F422 Mixed obsessional thoughts and acts: Secondary | ICD-10-CM

## 2019-09-30 DIAGNOSIS — F411 Generalized anxiety disorder: Secondary | ICD-10-CM

## 2019-09-30 DIAGNOSIS — F341 Dysthymic disorder: Secondary | ICD-10-CM

## 2019-10-11 NOTE — Progress Notes (Signed)
[facility]  Patient ID: Alexander Hayes,  MRN: [id number]  PCP: Lawerance Cruel, MD  Date of Evaluation: 09/24/2019 Time spent:10 minutes from [id number]  Chief Complaint:  Chief Complaint    Depression; Anxiety; Stress      HISTORY/CURRENT STATUS: Alexander Hayes is provided telemedicine audiovisual appointment session, declining the video as he is on break at work walking outside, phone to phone individually with consent with epic collateral for psychiatric interview and exam in 55-month evaluation and management of dysthymia, generalized anxiety, and OCD.  From his first appointment with me, the patient seemed to struggle with the opportunity as well as the stress of change and new provider having OCD and generalized anxiety.  He initially concluded Zoloft had too many side effects and was not working well enough therefore making change over to other medications particularly Wellbutrin and Lexapro which he found dissatisfying in his frequent phone calls to change then changing back to Zoloft then being happy as he started more frequent therapy with a new therapist.  He later concluded he had only mild side effects from Zoloft for a week which resolved after previous unsuccessful lithium, Paxil, and Cymbalta. Alexander Hayes reported having more physical and emotional energy with improved mood by using only modest alcohol and no THC or tobacco.  He states today his breakdowns are less frequent and severe so that he copes through them without changing the consistent sources of support and strength in his daily life.  Did find a new therapist Dorita Sciara @ [address] [facility] so he concludes that he does not consider medications the only answer any longer, valuing the therapy which helps with the equivalent of adult growing up.  He recompensates at home emotionally each day.  He has no mania, suicidality, psychosis, or delirium.  Individual Medical History/ Review of Systems: Changes? :No   Allergies:  Patient has no known allergies.  Current Medications:  Current Outpatient Medications:  .  sertraline (ZOLOFT) 100 MG tablet, TAKE 1 AND 1/2 TABLETS BY MOUTH EVERY DAY, Disp: 135 tablet, Rfl: 0  Medication Side Effects: none  Family Medical/ Social History: Changes? No  MENTAL HEALTH EXAM:  There were no vitals taken for this visit.There is no height or weight on file to calculate BMI.  General Appearance: N/A  Eye Contact:  N/A  Speech:  Clear and Coherent, Garbled, Normal Rate and Talkative  Volume:  Normal to increased  Mood:  Anxious, Depressed, Dysphoric and Euthymic  Affect:  Depressed, Inappropriate, Labile and Anxious  Thought Process:  Coherent, Goal Directed, Irrelevant and Descriptions of Associations: Tangential and circumstantial  Orientation:  Full (Time, Place, and Person)  Thought Content: Obsessions, Rumination and Tangential   Suicidal Thoughts:  No  Homicidal Thoughts:  No  Memory:  Immediate;   Good Remote;   Fair  Judgement:  Fair  Insight:  Fair  Psychomotor Activity:  N/A  Concentration:  Concentration: Fair and Attention Span: Good  Recall:  Good  Fund of Knowledge: Good  Language: Fair  Assets:  Desire for Improvement Leisure Time Resilience  ADL's:  Intact  Cognition: WNL  Prognosis:  Fair    DIAGNOSES:    ICD-10-CM   1. Persistent depressive disorder with anxious distress, currently mild  F34.1   2. Mixed obsessional thoughts and acts  F42.2 DISCONTINUED: sertraline (ZOLOFT) 100 MG tablet  3. Generalized anxiety disorder  F41.1 DISCONTINUED: sertraline (ZOLOFT) 100 MG tablet    Receiving Psychotherapy: Yes  with Dorita [facility], [facility]   RECOMMENDATIONS:  Psychosupportive psychoeducation integrates cognitive behavioral therapy underway with Gasper Sells with medication treatment course patient satisfied now with current symptom treatment matching as he continues to apply sleep hygiene, social skills, and frustration management interventions.  He is  E scribed Zoloft 100 mg tablet taking 1.5 tablets daily as #135 with 1 ref[facility] [address].  We discuss medications that can be considered such as BuSpar, Trilafon, and Inderal in the future if needed.  He returns in 3 months for follow-up or sooner if needed.  Virtual Visit via Video Note  I connected with Lakeem Rozo Tammen on 10/11/19 at  3:40 PM EDT by a video enabled telemedicine application and verified that I am speaking with the correct person using two identifiers.  Location: Patient: Individually at work on a walk with privacy Provider: [facility]   I discussed the limitations of evaluation and management by telemedicine and the availability of in person appointments. The patient expressed understanding and agreed to proceed.  History of Present Illness: 59-month evaluation and management of dysthymia, generalized anxiety, and OCD.  From his first appointment with me, the patient seemed to struggle with the opportunity as well as the stress of change and new provider having OCD and generalized anxiety.    Observations/Objective: Mood:  Anxious, Depressed, Dysphoric and Euthymic  Affect:  Depressed, Inappropriate, Labile and Anxious  Thought Process:  Coherent, Goal Directed, Irrelevant and Descriptions of Associations: Tangential and circumstantial  Orientation:  Full (Time, Place, and Person)  Thought Content: Obsessions, Rumination and Tangential    Assessment and Plan: Psychosupportive psychoeducation integrates cognitive behavioral therapy underway with Gasper Sells with medication treatment course patient satisfied now with current symptom treatment matching as he continues to apply sleep hygiene, social skills, and frustration management interventions.  He is E scribed Zoloft 100 mg tablet taking 1.5 tablets daily as #135 with 1 refill [facility] to [facility] [address].  We discuss medications that can be considered such as BuSpar, Trilafon, and  Inderal in the future if needed  Follow Up Instructions:  He returns in 3 months for follow-up or sooner if needed.    I discussed the assessment and treatment plan with the patient. The patient was provided an opportunity to ask questions and all were answered. The patient agreed with the plan and demonstrated an understanding of the instructions.   The patient was advised to call back or seek an in-person evaluation if the symptoms worsen or if the condition fails to improve as anticipated.  I provided 10 minutes of non-face-to-face time during this encounter. [employer] meeting #[id number] Meeting password:  [email or web address] [phone]  Chauncey Mann, MD  Chauncey Mann, MD

## 2020-01-17 ENCOUNTER — Ambulatory Visit: Payer: BC Managed Care – PPO | Attending: Internal Medicine

## 2020-01-17 DIAGNOSIS — Z23 Encounter for immunization: Secondary | ICD-10-CM | POA: Insufficient documentation

## 2020-01-17 NOTE — Progress Notes (Signed)
   Covid-19 Vaccination Clinic  Name:  RAYMAR JOINER    MRN: [id number] DOB: [birth year]  01/17/2020  Mr. Blanchfield was observed post Covid-19 immunization for 15 minutes without incidence. He was provided with Vaccine Information Sheet and instruction to access the V-Safe system.   Mr. Liuzzi was instructed to call 911 with any severe reactions post vaccine: Marland Kitchen Difficulty breathing  . Swelling of your face and throat  . A fast heartbeat  . A bad rash all over your body  . Dizziness and weakness    Immunizations Administered    Name Date Dose VIS Date Route   Pfizer COVID-19 Vaccine 01/17/2020  1:53 PM 0.3 mL 11/08/2019 Intramuscular   Manufacturer: [employer], [employer]   Lot: [id number]   NDC: [address]-1

## 2020-02-11 ENCOUNTER — Ambulatory Visit: Payer: BC Managed Care – PPO | Attending: Internal Medicine

## 2020-02-11 DIAGNOSIS — Z23 Encounter for immunization: Secondary | ICD-10-CM

## 2020-02-11 NOTE — Progress Notes (Signed)
   Covid-19 Vaccination Clinic  Name:  Alexander Hayes    MRN: [id number] DOB: 22-Jan-1991  02/11/2020  Alexander Hayes was observed post Covid-19 immunization for 15 minutes without incident. He was provided with Vaccine Information Sheet and instruction to access the V-Safe system.   Alexander Hayes was instructed to call 911 with any severe reactions post vaccine: Marland Kitchen Difficulty breathing  . Swelling of face and throat  . A fast heartbeat  . A bad rash all over body  . Dizziness and weakness   Immunizations Administered    Name Date Dose VIS Date Route   Pfizer COVID-19 Vaccine 02/11/2020  8:40 AM 0.3 mL 11/08/2019 Intramuscular   Manufacturer: [employer], [employer]   Lot: [id number]   NDC: [address]-1

## 2020-04-09 ENCOUNTER — Telehealth (INDEPENDENT_AMBULATORY_CARE_PROVIDER_SITE_OTHER): Payer: BC Managed Care – PPO | Admitting: Psychiatry

## 2020-04-09 ENCOUNTER — Encounter: Payer: Self-pay | Admitting: Psychiatry

## 2020-04-09 DIAGNOSIS — F341 Dysthymic disorder: Secondary | ICD-10-CM

## 2020-04-09 DIAGNOSIS — F411 Generalized anxiety disorder: Secondary | ICD-10-CM | POA: Diagnosis not present

## 2020-04-09 DIAGNOSIS — F422 Mixed obsessional thoughts and acts: Secondary | ICD-10-CM

## 2020-04-09 MED ORDER — SERTRALINE HCL 100 MG PO TABS: 200.0000 mg | ORAL_TABLET | Freq: Every day | ORAL | 0 refills | Status: DC

## 2020-04-09 NOTE — Progress Notes (Addendum)
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  Patient's care started here 5 years ago with Anne Fu, PA as Zoloft 25 mg a day then in the course of his care combined with lithium, Xanax, and Wellbutrin or changed to Paxil or Cymbalta.  Patient has only now in the last year made significant progress most attributable to his with psychotherapist Gasper Sells, [facility] last appointment for therapy being yesterday. They see lots of improvement though still with high anxiety, patient accepting her diagnosis of generalized anxiety and obsessive-compulsive disorders with comorbid dysthymia.  Patient notes having habits as he debates with himself about the OCD diagnosis in his employment and residence. Continuing change in his work with therapist today, he requests to complete dosing titration of Zoloft to the 200 mg OCD dose.  He takes his medication with coffee in the morning.  He has no mania, suicidality, psychosis or delirium.   Individual Medical History/ Review of Systems: Changes? :Yes Hearing to have the last hearing procedure in February 2020 denying any intercurrent immunological or infectious symptoms  Allergies: Patient has no known allergies.  Current Medications:  Current Outpatient Medications:  .  sertraline (ZOLOFT) 100 MG tablet, Take 2 tablets (200 mg  total) by mouth daily after breakfast., Disp: 180 tablet, Rfl: 0  Medication Side Effects: none  Family Medical/ Social History: Changes? No  MENTAL HEALTH EXAM:  There were no vitals taken for this visit.There is no height or weight on file to calculate BMI.  General Appearance: N/A  Eye Contact:  N/A  Speech:  Clear and Coherent, Garbled, Pressured and Talkative  Volume:  Increased  Mood:  Anxious, Dysphoric and Euthymic  Affect:  Congruent, Inappropriate, Labile, Full Range and Anxious  Thought Process:  Coherent, Goal Directed, Irrelevant and Descriptions of Associations: Circumstantial  Orientation:  Full (Time, Place, and Person)  Thought Content: Ilusions, Obsessions and Rumination   Suicidal Thoughts:  No  Homicidal Thoughts:  No  Memory:  Immediate;   Good Remote: Good  Judgement:  Fair  Insight:  Fair  Psychomotor Activity:  N/A  Concentration:  Concentration: Fair and Attention Span: Good  Recall:  Good  Fund of Knowledge: Good  Language: Fair  Assets:  Desire for Improvement Resilience Social Support Talents/Skills  ADL's:  Intact  Cognition:  WNL  Prognosis:  Good    DIAGNOSES:    ICD-10-CM   1. Persistent depressive disorder with anxious distress, currently mild  F34.1   2. Mixed obsessional thoughts and acts  F42.2 DISCONTINUED: sertraline (ZOLOFT) 100 MG tablet  3. Generalized anxiety disorder  F41.1 DISCONTINUED: sertraline (ZOLOFT) 100 MG tablet    Receiving Psychotherapy: Yes Romualdo Bolk, [facility] last session yesterday   RECOMMENDATIONS: Psychosupportive psychoeducation concurs with process of therapy relative to findings and treatment needed.  He now comfortably agrees to consideration all along of Zoloft E scribed as 100 mg tablets take 2 tablets total 200 mg  every morning with coffee sent as #180 tablets with no refill to [facility] for GAD, OCD, and dysthymia.  He returns for follow-up in 3 months or as needed understanding prevention and  monitoring for safety hygiene.   Delight Hoh, MD   The telehealth annual consent completed in appointment is not in the electronic record possibly lost in the signing and distribution process will be checking for entry error elsewhere to be returned.  Delight Hoh, MD

## 2020-04-10 ENCOUNTER — Other Ambulatory Visit: Payer: Self-pay

## 2020-04-10 DIAGNOSIS — F411 Generalized anxiety disorder: Secondary | ICD-10-CM

## 2020-04-10 DIAGNOSIS — F341 Dysthymic disorder: Secondary | ICD-10-CM

## 2020-04-10 DIAGNOSIS — F422 Mixed obsessional thoughts and acts: Secondary | ICD-10-CM

## 2020-04-10 MED ORDER — SERTRALINE HCL 100 MG PO TABS: 200.0000 mg | ORAL_TABLET | Freq: Every day | ORAL | 0 refills | Status: DC

## 2020-04-10 NOTE — Progress Notes (Signed)
[facility] on patient's instructions for Sertraline 100 mg 2 tablets daily after breakfast. Dose increase.

## 2020-07-01 ENCOUNTER — Other Ambulatory Visit: Payer: Self-pay | Admitting: Psychiatry

## 2020-07-01 DIAGNOSIS — F422 Mixed obsessional thoughts and acts: Secondary | ICD-10-CM

## 2020-07-01 DIAGNOSIS — F341 Dysthymic disorder: Secondary | ICD-10-CM

## 2020-07-01 DIAGNOSIS — F411 Generalized anxiety disorder: Secondary | ICD-10-CM

## 2020-07-02 ENCOUNTER — Encounter: Payer: Self-pay | Admitting: Psychiatry

## 2020-07-02 ENCOUNTER — Telehealth (INDEPENDENT_AMBULATORY_CARE_PROVIDER_SITE_OTHER): Payer: BC Managed Care – PPO | Admitting: Psychiatry

## 2020-07-02 ENCOUNTER — Telehealth: Payer: Self-pay | Admitting: Psychiatry

## 2020-07-02 DIAGNOSIS — F422 Mixed obsessional thoughts and acts: Secondary | ICD-10-CM | POA: Diagnosis not present

## 2020-07-02 DIAGNOSIS — F411 Generalized anxiety disorder: Secondary | ICD-10-CM

## 2020-07-02 DIAGNOSIS — F341 Dysthymic disorder: Secondary | ICD-10-CM

## 2020-07-02 NOTE — Telephone Encounter (Signed)
Apt today 08/05 

## 2020-07-02 NOTE — Telephone Encounter (Signed)
Mr. orrie, schubert are scheduled for a virtual visit with your provider today.    Just as we do with appointments in the office, we must obtain your consent to participate.  Your consent will be active for this visit and any virtual visit you may have with one of our providers in the next 365 days.    If you have a MyChart account, I can also send a copy of this consent to you electronically.  All virtual visits are billed to your insurance company just like a traditional visit in the office.  As this is a virtual visit, video technology does not allow for your provider to perform a traditional examination.  This may limit your provider's ability to fully assess your condition.  If your provider identifies any concerns that need to be evaluated in person or the need to arrange testing such as labs, EKG, etc, we will make arrangements to do so.    Although advances in technology are sophisticated, we cannot ensure that it will always work on either your end or our end.  If the connection with a video visit is poor, we may have to switch to a telephone visit.  With either a video or telephone visit, we are not always able to ensure that we have a secure connection.   I need to obtain your verbal consent now.   Are you willing to proceed with your visit today?   Yohannes Waibel Magid has provided verbal consent on 07/02/2020 for a virtual visit (video or telephone).   Chauncey Mann, MD 07/02/2020  1:50 PM

## 2020-07-02 NOTE — Progress Notes (Signed)
[facility]  Patient ID: ARATH KAIGLER,  MRN: [id number]  PCP: Daisy Floro, MD  Date of Evaluation: 07/02/2020 Time spent:15 minutes  Chief Complaint:  Chief Complaint    Depression; Anxiety; Stress      HISTORY/CURRENT STATUS: Alexander Hayes is provided telemedicine audiovisual appointment session as MyChart Caregility Video Visit individually 15 minutes video to video with documented telehealth consent form fully educated with epic collateral for psychiatric interview and exam in 78-month evaluation and management of dysthymia, generalized anxiety, and OCD.  Patient informs me he must have the video session today as he is out of work with COVID currently contracting it 2 months after his vaccination.  His symptoms are not bad so that he has confidence he did the right thing in the vaccine.  He has no difficulty with his cochlear implants thus far.  When he transferred care from deceased [facility], the patient requested and was tried on additional medication but as in the past he did not tolerate or benefit from any other than Zoloft, including not realistically taking Luvox and Wellbutrin after previous lithium, Paxil, and Cymbalta.  He manifest some obsessive slowness seeking rechecking reassurance answers to his questions today whether he really has OCD from his reading of his records and discussion with his therapist Gasper Sells, [facility].  He exhibits obsessional inflexibility about any discussion of his medications manifesting the need to continue the Zoloft, though he does wonder if in the future he will not need the medication.  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  He seems to prefer and be more secure in his requirement today for appointment every 3 months.  He has no mania, suicidality, psychosis or delirium.   Individual Medical History/ Review of Systems: Changes? :Yes  today being  out of work with COVID currently contracting it 2 months after his vaccination.  Allergies: Patient has no known allergies.  Current Medications:  Current Outpatient Medications:  .  sertraline (ZOLOFT) 100 MG tablet, TAKE 2 TABLETS (200 MG TOTAL) BY MOUTH DAILY AFTER BREAKFAST., Disp: 180 tablet, Rfl: 1   Medication Side Effects: none  Family Medical/ Social History: Changes? No  MENTAL HEALTH EXAM:  There were no vitals taken for this visit.There is no height or weight on file to calculate BMI.  aes not present here today but by video AIMS = 0.  General Appearance: Casual, Fairly Groomed, Guarded and Meticulous  Eye Contact:  Fair  Speech:  Clear and Coherent, Garbled, Normal Rate and Talkative  Volume:  Normal  Mood:  Anxious, Dysphoric and Euthymic  Affect:  Congruent, Inappropriate, Full Range and Anxious  Thought Process:  Coherent, Goal Directed, Irrelevant and Descriptions of Associations: Circumstantial  Orientation:  Full (Time, Place, and Person)  Thought Content: Obsessions and Rumination   Suicidal Thoughts:  No  Homicidal Thoughts:  No  Memory:  Immediate;   Good Remote;   Good  Judgement:  Fair  Insight:  Fair  Psychomotor Activity:  Normal and Mannerisms  Concentration:  Concentration: Fair and Attention Span: Good  Recall:  Good  Fund of Knowledge: Good  Language: Fair  Assets:  Desire for Improvement Leisure Time Resilience Talents/Skills Vocational/Educational  ADL's:  Intact  Cognition: WNL  Prognosis:  Good    DIAGNOSES:    ICD-10-CM   1. Persistent depressive disorder with anxious distress, currently mild  F34.1   2. Generalized anxiety disorder  F41.1   3. Mixed obsessional thoughts and acts  F42.2     Receiving Psychotherapy: Yes  Gasper Sells, [facility] @ [facility]    RECOMMENDATIONS: [place] registry is appropriate for continued outpatient escribing currently only Zoloft sending eScription 100 mg taking 2 every morning as #180 with 1 refill  to [facility] for OCD, generalized anxiety, and dysthymia.  Psychoeducation is updated for prevention and monitoring safety hygiene.  He continues psychotherapy. He plans return here for follow-up, having started with Anne Fu, PA-C now deceased, in 6 months or sooner if needed.  Virtual Visit via Video Note  I connected with Aeron Donaghey Aultman on 07/02/20 at  1:40 PM EDT by a video enabled telemedicine application and verified that I am speaking with the correct person using two identifiers.  Location: Patient: Individually video to video with privacy and personal residence Provider: [facility] office   I discussed the limitations of evaluation and management by telemedicine and the availability of in person appointments. The patient expressed understanding and agreed to proceed.  History of Present Illness: 71-month evaluation and management addresses dysthymia, generalized anxiety, and OCD.     Observations/Objective: Mood:  Anxious, Dysphoric and Euthymic  Affect:  Congruent, Inappropriate, Full Range and Anxious  Thought Process:  Coherent, Goal Directed, Irrelevant and Descriptions of Associations: Circumstantial  Orientation:  Full (Time, Place, and Person)  Thought Content: Obsessions and Rumination    Assessment and Plan: [place] registry is appropriate for continued outpatient escribing currently only Zoloft sending eScription 100 mg taking 2 every morning as #180 with 1 refill to [facility] for OCD, generalized anxiety, and dysthymia.  Psychoeducation is updated for prevention and monitoring safety hygiene.  He continues psychotherapy.   Follow Up Instructions: He plans return here for follow-up, having started with Anne Fu, PA-C now deceased, in 6 months if possible as a slight modification or sooner if needed.    I discussed the assessment and treatment plan with the patient. The patient was provided an opportunity to ask questions and  all were answered. The patient agreed with the plan and demonstrated an understanding of the instructions.   The patient was advised to call back or seek an in-person evaluation if the symptoms worsen or if the condition fails to improve as anticipated.  I provided 15 minutes of non-face-to-face time during this encounter.   Chauncey Mann, MD   Chauncey Mann, MD

## 2020-09-16 ENCOUNTER — Encounter: Payer: Self-pay | Admitting: Psychiatry

## 2021-01-05 ENCOUNTER — Encounter: Payer: Self-pay | Admitting: Adult Health

## 2021-01-05 ENCOUNTER — Ambulatory Visit (INDEPENDENT_AMBULATORY_CARE_PROVIDER_SITE_OTHER): Payer: BC Managed Care – PPO | Admitting: Adult Health

## 2021-01-05 ENCOUNTER — Other Ambulatory Visit: Payer: Self-pay

## 2021-01-05 DIAGNOSIS — F422 Mixed obsessional thoughts and acts: Secondary | ICD-10-CM

## 2021-01-05 DIAGNOSIS — F411 Generalized anxiety disorder: Secondary | ICD-10-CM

## 2021-01-05 DIAGNOSIS — F341 Dysthymic disorder: Secondary | ICD-10-CM

## 2021-01-05 MED ORDER — SERTRALINE HCL 100 MG PO TABS: 200.0000 mg | ORAL_TABLET | Freq: Every day | ORAL | 1 refills | Status: DC

## 2021-01-05 NOTE — Progress Notes (Signed)
Alexander Hayes [id number] [birth year] 30 y.o.  Subjective:   Patient ID:  Alexander Hayes is a 30 y.o. (DOB 1990/12/01) male.  Chief Complaint: No chief complaint on file.   HPI Alexander Hayes presents to the office today for follow-up of obsessional thoughts, GAD, and depression.  Describes mood today as "ok". Pleasant. Mood symptoms - denies depression and irritability. Feels anxious at times. Stating "I'm doing pretty good". Feels like the Zoloft is working well for him at 200mg  daily. Stable interest and motivation. Taking medications as prescribed. Energy levels stable. Active, has a regular exercise routine. Running 4 to 5 times a week. Enjoys some usual interests and activities. Single. Lives alone. No pets. Parents local. Extended family local. Spending time with family and friends. Appetite adequate.  Weight stable - 185 pounds. Sleeps well most nights. Averages 6 hours. Focus and concentration pretty good. Completing tasks. Managing aspects of household. Works at x 5 years. Denies SI or HI.  Denies AH or VH.  Previous medication trials: Luvoxamine   Review of Systems:  Review of Systems  Musculoskeletal: Negative for gait problem.  Neurological: Negative for tremors.  Psychiatric/Behavioral:       Please refer to HPI    Medications: I have reviewed the patient's current medications.  Current Outpatient Medications  Medication Sig Dispense Refill  . sertraline (ZOLOFT) 100 MG tablet Take 2 tablets (200 mg total) by mouth daily after breakfast. 180 tablet 1   No current facility-administered medications for this visit.    Medication Side Effects: None  Allergies: No Known Allergies  Past Medical History:  Diagnosis Date  . Anxiety   . Deafness congenital   . Depression   . Obsessive-compulsive disorder     Family History  Problem Relation Age of Onset  . Depression Paternal Grandmother     Social History   Socioeconomic History   . Marital status: Single    Spouse name: Not on file  . Number of children: Not on file  . Years of education: Not on file  . Highest education level: Bachelor's degree (e.g., BA, AB, BS)  Occupational History  . Not on file  Tobacco Use  . Smoking status: Former [employer]  . Smokeless tobacco: Never Used  Vaping Use  . Vaping Use: Never used  Substance and Sexual Activity  . Alcohol use: Yes  . Drug use: Not Currently    Types: Marijuana, Psilocybin, Amphetamines  . Sexual activity: Not on file  Other Topics Concern  . 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  Recent girlfriend last year broke up last fall.  He obsesses about whether he was appropriate in buying his own house and caring for his dog.  He has conflicts with father who readily disapproves of the patient's style of decision making and future planning.   Social Determinants of Health   Financial Resource Strain: Not on file  Food Insecurity: Not on file  Transportation Needs: Not on file  Physical Activity: Not on file  Stress: Not on file  Social Connections: Not on file  Intimate Partner Violence: Not on file    Past Medical History, Surgical history, Social history, and Family history were reviewed and updated as appropriate.   Please see review of systems for further details on the  patient's review from today.   Objective:   Physical Exam:  There were no vitals taken for this visit.  Physical Exam Constitutional:      General: He is not in acute distress. Musculoskeletal:        General: No deformity.  Neurological:     Mental Status: He is alert and oriented to person, place, and time.     Coordination: Coordination normal.  Psychiatric:        Attention and Perception: Attention and perception normal. He  does not perceive auditory or visual hallucinations.        Mood and Affect: Mood normal. Mood is not anxious or depressed. Affect is not labile, blunt, angry or inappropriate.        Speech: Speech normal.        Behavior: Behavior normal.        Thought Content: Thought content normal. Thought content is not paranoid or delusional. Thought content does not include homicidal or suicidal ideation. Thought content does not include homicidal or suicidal plan.        Cognition and Memory: Cognition and memory normal.        Judgment: Judgment normal.     Comments: Insight intact     Lab Review:  No results found for: NA, K, CL, CO2, GLUCOSE, BUN, CREATININE, CALCIUM, PROT, ALBUMIN, AST, ALT, ALKPHOS, BILITOT, GFRNONAA, GFRAA  No results found for: WBC, RBC, HGB, HCT, PLT, MCV, MCH, MCHC, RDW, LYMPHSABS, MONOABS, EOSABS, BASOSABS  No results found for: POCLITH, LITHIUM   No results found for: PHENYTOIN, PHENOBARB, VALPROATE, CBMZ   .res Assessment: Plan:    Plan:  PDMP reviewed  1. Zoloft 200mg  daily  Read and reviewed note with patient for accuracy.   RTC 6 months  Patient advised to contact office with any questions, adverse effects, or acute worsening in signs and symptoms.  Diagnoses and all orders for this visit:  Persistent depressive disorder with anxious distress, currently mild  Generalized anxiety disorder -     sertraline (ZOLOFT) 100 MG tablet; Take 2 tablets (200 mg total) by mouth daily after breakfast.  Mixed obsessional thoughts and acts -     sertraline (ZOLOFT) 100 MG tablet; Take 2 tablets (200 mg total) by mouth daily after breakfast.  Persistent depressive disorder with anxious distress, currently moderate -     sertraline (ZOLOFT) 100 MG tablet; Take 2 tablets (200 mg total) by mouth daily after breakfast.     Please see After Visit Summary for patient specific instructions.  Future Appointments  Date Time Provider Department Center  07/05/2021   4:40 PM Sandee Bernath, 09/04/2021, NP [facility] None    No orders of the defined types were placed in this encounter.   -------------------------------

## 2021-07-05 ENCOUNTER — Telehealth (INDEPENDENT_AMBULATORY_CARE_PROVIDER_SITE_OTHER): Payer: BC Managed Care – PPO | Admitting: Adult Health

## 2021-07-05 ENCOUNTER — Other Ambulatory Visit: Payer: Self-pay

## 2021-07-05 ENCOUNTER — Encounter: Payer: Self-pay | Admitting: Adult Health

## 2021-07-05 DIAGNOSIS — F411 Generalized anxiety disorder: Secondary | ICD-10-CM | POA: Diagnosis not present

## 2021-07-05 DIAGNOSIS — F422 Mixed obsessional thoughts and acts: Secondary | ICD-10-CM | POA: Diagnosis not present

## 2021-07-05 DIAGNOSIS — F341 Dysthymic disorder: Secondary | ICD-10-CM

## 2021-07-05 MED ORDER — SERTRALINE HCL 100 MG PO TABS: 200.0000 mg | ORAL_TABLET | Freq: Every day | ORAL | 1 refills | Status: DC

## 2021-07-05 NOTE — Progress Notes (Signed)
Alexander Hayes [id number] 08/06/1991 30 y.o.  Virtual Visit via Video Note  I connected with pt @ on 07/05/21 at  4:40 PM EDT by a video enabled telemedicine application and verified that I am speaking with the correct person using two identifiers.   I discussed the limitations of evaluation and management by telemedicine and the availability of in person appointments. The patient expressed understanding and agreed to proceed.  I discussed the assessment and treatment plan with the patient. The patient was provided an opportunity to ask questions and all were answered. The patient agreed with the plan and demonstrated an understanding of the instructions.   The patient was advised to call back or seek an in-person evaluation if the symptoms worsen or if the condition fails to improve as anticipated.  I provided 15 minutes of non-face-to-face time during this encounter.  The patient was located at home.  The provider was located at [facility].   Dorothyann Gibbs, NP   Subjective:   Patient ID:  Alexander Hayes is a 30 y.o. (DOB August 31, 1991) male.  Chief Complaint: No chief complaint on file.   HPI Alexander Hayes presents for follow-up of mixed obsessional thoughts, GAD, and depression.  Describes mood today as "ok". Pleasant. Mood symptoms - denies anxiety, depression and irritability. Stating "I'm doing alright". Feels like the Zoloft is working well for him. Able to "process" things better. Stating "I feel stable". At home with Covid - 2nd time. Stable interest and motivation. Taking medications as prescribed. Energy levels stable. Active, has a regular exercise routine. Enjoys some usual interests and activities. Single. Lives alone. No pets. Parents local. Extended family local. Spending time with family and friends. Appetite adequate.  Weight gain 5 pounds - 185 to 190 pounds. Sleeps well most nights. Averages 6 hours. Focus and concentration pretty good.  Completing tasks. Managing aspects of household. Works at [employer] x 5 years. Denies SI or HI.  Denies AH or VH.  Previous medication trials: Fluvoxamine     Review of Systems:  Review of Systems  Musculoskeletal:  Negative for gait problem.  Neurological:  Negative for tremors.  Psychiatric/Behavioral:         Please refer to HPI   Medications: I have reviewed the patient's current medications.  Current Outpatient Medications  Medication Sig Dispense Refill   sertraline (ZOLOFT) 100 MG tablet Take 2 tablets (200 mg total) by mouth daily after breakfast. 180 tablet 1   No current facility-administered medications for this visit.    Medication Side Effects: None  Allergies: No Known Allergies  Past Medical History:  Diagnosis Date   Anxiety    Deafness congenital    Depression    Obsessive-compulsive disorder     Family History  Problem Relation Age of Onset   Depression Paternal Grandmother     Social History   Socioeconomic History   Marital status: Single    Spouse name: Not on file   Number of children: Not on file   Years of education: Not on file   Highest education level: Bachelor's degree (e.g., BA, AB, BS)  Occupational History   Not on file  Tobacco Use   Smoking status: Former   Smokeless tobacco: Never  Vaping Use   Vaping Use: Never used  Substance and Sexual Activity   Alcohol use: Yes   Drug use: Not Currently    Types: Marijuana, Psilocybin, Amphetamines   Sexual activity: Not on file  Other Topics Concern  Not on file  Social History Narrative   [employer] 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  Recent girlfriend last year broke up last fall.  He obsesses about whether he was appropriate in buying his own house and caring for his dog.  He has conflicts with father who readily  disapproves of the patient's style of decision making and future planning.   Social Determinants of Health   Financial Resource Strain: Not on file  Food Insecurity: Not on file  Transportation Needs: Not on file  Physical Activity: Not on file  Stress: Not on file  Social Connections: Not on file  Intimate Partner Violence: Not on file    Past Medical History, Surgical history, Social history, and Family history were reviewed and updated as appropriate.   Please see review of systems for further details on the patient's review from today.   Objective:   Physical Exam:  There were no vitals taken for this visit.  Physical Exam Constitutional:      General: He is not in acute distress. Musculoskeletal:        General: No deformity.  Neurological:     Mental Status: He is alert and oriented to person, place, and time.     Coordination: Coordination normal.  Psychiatric:        Attention and Perception: Attention and perception normal. He does not perceive auditory or visual hallucinations.        Mood and Affect: Mood normal. Mood is not anxious or depressed. Affect is not labile, blunt, angry or inappropriate.        Speech: Speech normal.        Behavior: Behavior normal.        Thought Content: Thought content normal. Thought content is not paranoid or delusional. Thought content does not include homicidal or suicidal ideation. Thought content does not include homicidal or suicidal plan.        Cognition and Memory: Cognition and memory normal.        Judgment: Judgment normal.     Comments: Insight intact    Lab Review:  No results found for: NA, K, CL, CO2, GLUCOSE, BUN, CREATININE, CALCIUM, PROT, ALBUMIN, AST, ALT, ALKPHOS, BILITOT, GFRNONAA, GFRAA  No results found for: WBC, RBC, HGB, HCT, PLT, MCV, MCH, MCHC, RDW, LYMPHSABS, MONOABS, EOSABS, BASOSABS  No results found for: POCLITH, LITHIUM   No results found for: PHENYTOIN, PHENOBARB, VALPROATE, CBMZ    .res Assessment: Plan:     Plan:  PDMP reviewed  1. Zoloft 200mg  daily  Read and reviewed note with patient for accuracy.   RTC 6 months  Patient advised to contact office with any questions, adverse effects, or acute worsening in signs and symptoms.  There are no diagnoses linked to this encounter.   Please see After Visit Summary for patient specific instructions.  Future Appointments  Date Time Provider Department Center  07/05/2021  4:40 PM Dameir Gentzler, 09/04/2021, NP [facility] None    No orders of the defined types were placed in this encounter.     -------------------------------

## 2022-01-17 ENCOUNTER — Other Ambulatory Visit: Payer: Self-pay | Admitting: Adult Health

## 2022-01-17 DIAGNOSIS — F422 Mixed obsessional thoughts and acts: Secondary | ICD-10-CM

## 2022-01-17 DIAGNOSIS — F411 Generalized anxiety disorder: Secondary | ICD-10-CM

## 2022-01-18 NOTE — Telephone Encounter (Signed)
LVM for pt to call and schedule  °

## 2022-01-18 NOTE — Telephone Encounter (Signed)
Please call patient to schedule a F/U. He was last seen 8/8 with RTC in 6 mo. He is due for F/U this month.

## 2022-01-24 ENCOUNTER — Telehealth (INDEPENDENT_AMBULATORY_CARE_PROVIDER_SITE_OTHER): Payer: BC Managed Care – PPO | Admitting: Adult Health

## 2022-01-24 ENCOUNTER — Encounter: Payer: Self-pay | Admitting: Adult Health

## 2022-01-24 ENCOUNTER — Ambulatory Visit: Payer: BC Managed Care – PPO | Admitting: Adult Health

## 2022-01-24 DIAGNOSIS — F411 Generalized anxiety disorder: Secondary | ICD-10-CM | POA: Diagnosis not present

## 2022-01-24 DIAGNOSIS — F422 Mixed obsessional thoughts and acts: Secondary | ICD-10-CM

## 2022-01-24 DIAGNOSIS — F341 Dysthymic disorder: Secondary | ICD-10-CM

## 2022-01-24 MED ORDER — SERTRALINE HCL 100 MG PO TABS: 200.0000 mg | ORAL_TABLET | Freq: Every day | ORAL | 3 refills | Status: DC

## 2022-01-24 NOTE — Progress Notes (Signed)
SAYRE WITHERINGTON [id number] Mar 29, 1991 31 y.o.  Virtual Visit via Video Note  I connected with pt @ on 01/24/22 at  1:20 PM EST by a video enabled telemedicine application and verified that I am speaking with the correct person using two identifiers.   I discussed the limitations of evaluation and management by telemedicine and the availability of in person appointments. The patient expressed understanding and agreed to proceed.  I discussed the assessment and treatment plan with the patient. The patient was provided an opportunity to ask questions and all were answered. The patient agreed with the plan and demonstrated an understanding of the instructions.   The patient was advised to call back or seek an in-person evaluation if the symptoms worsen or if the condition fails to improve as anticipated.  I provided 20 minutes of non-face-to-face time during this encounter. The patient was located at home. The provider was located at [facility].   Dorothyann Gibbs, NP   Subjective:   Patient ID:  Alexander Hayes is a 31 y.o. (DOB Jun 29, 1991) male.  Chief Complaint: No chief complaint on file.   HPI Alexander Hayes presents for follow-up of mixed obsessional thoughts, GAD, and depression.  Describes mood today as "ok". Pleasant. Mood symptoms - denies anxiety, depression and irritability. Stating "I'm doing alright". Feels like the Zoloft continues to work well. Spending time with family and friends. Stable interest and motivation. Taking medications as prescribed. Energy levels stable. Active, has a regular exercise routine. Going to the gym. Enjoys some usual interests and activities. Single. Lives alone. No pets. Parents local. Extended family local. Spending time with family and friends. Appetite adequate. Weight gain 190 to 199 pounds. Sleeps well most nights. Averages 6 or more hours. Focus and concentration stable. Completing tasks. Managing aspects of household.  Works at [employer] x 5 years. Denies SI or HI.  Denies AH or VH.  Previous medication trials: Fluvoxamine   Review of Systems:  Review of Systems  Musculoskeletal:  Negative for gait problem.  Neurological:  Negative for tremors.  Psychiatric/Behavioral:         Please refer to HPI   Medications: I have reviewed the patient's current medications.  Current Outpatient Medications  Medication Sig Dispense Refill   sertraline (ZOLOFT) 100 MG tablet Take 2 tablets (200 mg total) by mouth daily after breakfast. 180 tablet 3   No current facility-administered medications for this visit.    Medication Side Effects: None  Allergies: No Known Allergies  Past Medical History:  Diagnosis Date   Anxiety    Deafness congenital    Depression    Obsessive-compulsive disorder     Family History  Problem Relation Age of Onset   Depression Paternal Grandmother     Social History   Socioeconomic History   Marital status: Single    Spouse name: Not on file   Number of children: Not on file   Years of education: Not on file   Highest education level: Bachelor's degree (e.g., BA, AB, BS)  Occupational History   Not on file  Tobacco Use   Smoking status: Former   Smokeless tobacco: Never  Vaping Use   Vaping Use: Never used  Substance and Sexual Activity   Alcohol use: Yes   Drug use: Not Currently    Types: Marijuana, Psilocybin, Amphetamines   Sexual activity: Not on file  Other Topics Concern   Not on file  Social History Narrative   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  Recent girlfriend last year broke up last fall.  He obsesses about whether he was appropriate in buying his own house and caring for his dog.  He has conflicts with father who readily disapproves of the patient's style of decision making and  future planning.   Social Determinants of Health   Financial Resource Strain: Not on file  Food Insecurity: Not on file  Transportation Needs: Not on file  Physical Activity: Not on file  Stress: Not on file  Social Connections: Not on file  Intimate Partner Violence: Not on file    Past Medical History, Surgical history, Social history, and Family history were reviewed and updated as appropriate.   Please see review of systems for further details on the patient's review from today.   Objective:   Physical Exam:  There were no vitals taken for this visit.  Physical Exam Constitutional:      General: He is not in acute distress. Musculoskeletal:        General: No deformity.  Neurological:     Mental Status: He is alert and oriented to person, place, and time.     Coordination: Coordination normal.  Psychiatric:        Attention and Perception: Attention and perception normal. He does not perceive auditory or visual hallucinations.        Mood and Affect: Mood normal. Mood is not anxious or depressed. Affect is not labile, blunt, angry or inappropriate.        Speech: Speech normal.        Behavior: Behavior normal.        Thought Content: Thought content normal. Thought content is not paranoid or delusional. Thought content does not include homicidal or suicidal ideation. Thought content does not include homicidal or suicidal plan.        Cognition and Memory: Cognition and memory normal.        Judgment: Judgment normal.     Comments: Insight intact    Lab Review:  No results found for: NA, K, CL, CO2, GLUCOSE, BUN, CREATININE, CALCIUM, PROT, ALBUMIN, AST, ALT, ALKPHOS, BILITOT, GFRNONAA, GFRAA  No results found for: WBC, RBC, HGB, HCT, PLT, MCV, MCH, MCHC, RDW, LYMPHSABS, MONOABS, EOSABS, BASOSABS  No results found for: POCLITH, LITHIUM   No results found for: PHENYTOIN, PHENOBARB, VALPROATE, CBMZ   .res Assessment: Plan:    Plan:  PDMP reviewed  1. Zoloft  200mg  daily  RTC 6 months  Patient advised to contact office with any questions, adverse effects, or acute worsening in signs and symptoms.  Time spent with patient was 20 minutes. Greater than 50% of face to face time with patient was spent on counseling and coordination of care.   Diagnoses and all orders for this visit:  Persistent depressive disorder with anxious distress, currently mild  Generalized anxiety disorder -     sertraline (ZOLOFT) 100 MG tablet; Take 2 tablets (200 mg total) by mouth daily after breakfast.  Mixed obsessional thoughts and acts -     sertraline (ZOLOFT) 100 MG tablet; Take 2 tablets (200 mg total) by mouth daily after breakfast.     Please see After Visit Summary for patient specific instructions.  No future appointments.   No orders of the defined types were placed in this encounter.     -------------------------------

## 2022-10-11 ENCOUNTER — Encounter: Payer: Self-pay | Admitting: Adult Health

## 2022-10-11 ENCOUNTER — Ambulatory Visit: Payer: BC Managed Care – PPO | Admitting: Adult Health

## 2022-10-11 DIAGNOSIS — F422 Mixed obsessional thoughts and acts: Secondary | ICD-10-CM

## 2022-10-11 DIAGNOSIS — F411 Generalized anxiety disorder: Secondary | ICD-10-CM

## 2022-10-11 DIAGNOSIS — F341 Dysthymic disorder: Secondary | ICD-10-CM | POA: Diagnosis not present

## 2022-10-11 MED ORDER — SERTRALINE HCL 100 MG PO TABS: 200.0000 mg | ORAL_TABLET | Freq: Every day | ORAL | 3 refills | Status: DC

## 2022-10-11 NOTE — Progress Notes (Signed)
Alexander Hayes [id number] [birth year] 31 y.o.  Subjective:   Patient ID:  Alexander Hayes is a 31 y.o. (DOB [birth year]) male.  Chief Complaint: No chief complaint on file.   HPI Alexander Hayes presents to the office today for follow-up of mixed obsessional thoughts, GAD, and depression.  Describes mood today as "ok". Pleasant. Mood symptoms - denies anxiety, depression and irritability. Mood is consistent. "I'm doing really well". Feels like the Zoloft continues to work well, but feels like his emotions are dull. Would like to decrease the current dose in hopes to establish a more "normal" mood. Spending time with family and friends. Stable interest and motivation. Taking medications as prescribed. Energy levels stable. Active, has a regular exercise routine. Going to the gym. Enjoys some usual interests and activities. Single. Lives alone. No pets. Parents local. Extended family local. Spending time with family and friends. Appetite adequate. Weight stable 195 pounds. Sleeps well most nights. Averages 6 to 7 hours. Focus and concentration stable. Completing tasks. Managing aspects of household. Works at [employer]. Denies SI or HI.  Denies AH or VH.  Seeing therapist monthly.  Previous medication trials: Fluvoxamine   Review of Systems:  Review of Systems  Musculoskeletal:  Negative for gait problem.  Neurological:  Negative for tremors.  Psychiatric/Behavioral:         Please refer to HPI    Medications: I have reviewed the patient's current medications.  Current Outpatient Medications  Medication Sig Dispense Refill   sertraline (ZOLOFT) 100 MG tablet Take 2 tablets (200 mg total) by mouth daily after breakfast. 180 tablet 3   No current facility-administered medications for this visit.    Medication Side Effects: None  Allergies: No Known Allergies  Past Medical History:  Diagnosis Date   Anxiety    Deafness congenital    Depression     Obsessive-compulsive disorder     Past Medical History, Surgical history, Social history, and Family history were reviewed and updated as appropriate.   Please see review of systems for further details on the patient's review from today.   Objective:   Physical Exam:  There were no vitals taken for this visit.  Physical Exam Constitutional:      General: He is not in acute distress. Musculoskeletal:        General: No deformity.  Neurological:     Mental Status: He is alert and oriented to person, place, and time.     Coordination: Coordination normal.  Psychiatric:        Attention and Perception: Attention and perception normal. He does not perceive auditory or visual hallucinations.        Mood and Affect: Mood normal. Mood is not anxious or depressed. Affect is not labile, blunt, angry or inappropriate.        Speech: Speech normal.        Behavior: Behavior normal.        Thought Content: Thought content normal. Thought content is not paranoid or delusional. Thought content does not include homicidal or suicidal ideation. Thought content does not include homicidal or suicidal plan.        Cognition and Memory: Cognition and memory normal.        Judgment: Judgment normal.     Comments: Insight intact     Lab Review:  No results found for: "NA", "K", "CL", "CO2", "GLUCOSE", "BUN", "CREATININE", "CALCIUM", "PROT", "ALBUMIN", "AST", "ALT", "ALKPHOS", "BILITOT", "GFRNONAA", "GFRAA"  No results found for: "WBC", "RBC", "HGB", "  HCT", "PLT", "MCV", "MCH", "MCHC", "RDW", "LYMPHSABS", "MONOABS", "EOSABS", "BASOSABS"  No results found for: "POCLITH", "LITHIUM"   No results found for: "PHENYTOIN", "PHENOBARB", "VALPROATE", "CBMZ"   .res Assessment: Plan:    Plan:  PDMP reviewed  1. Zoloft 200mg  daily - discussed taper down to 150mg  through January - then decrease to 100mg  if mood stable.  Continue therapy  RTC 6 months  Patient advised to contact office with any  questions, adverse effects, or acute worsening in signs and symptoms.  Time spent with patient was 20 minutes. Greater than 50% of face to face time with patient was spent on counseling and coordination of care.  There are no diagnoses linked to this encounter.   Please see After Visit Summary for patient specific instructions.  Future Appointments  Date Time Provider Department Center  10/11/2022  4:40 PM Alexander Hayes, February, NP CP-CP None    No orders of the defined types were placed in this encounter.   -------------------------------

## 2023-04-11 ENCOUNTER — Ambulatory Visit: Payer: BC Managed Care – PPO | Admitting: Adult Health

## 2024-02-07 ENCOUNTER — Other Ambulatory Visit: Payer: Self-pay | Admitting: Adult Health

## 2024-02-07 DIAGNOSIS — F422 Mixed obsessional thoughts and acts: Secondary | ICD-10-CM

## 2024-02-07 DIAGNOSIS — F411 Generalized anxiety disorder: Secondary | ICD-10-CM

## 2024-02-15 ENCOUNTER — Telehealth: Payer: Self-pay | Admitting: Adult Health

## 2024-02-15 DIAGNOSIS — F411 Generalized anxiety disorder: Secondary | ICD-10-CM

## 2024-02-15 DIAGNOSIS — F422 Mixed obsessional thoughts and acts: Secondary | ICD-10-CM

## 2024-02-15 MED ORDER — SERTRALINE HCL 100 MG PO TABS: 200.0000 mg | ORAL_TABLET | Freq: Every day | ORAL | 0 refills | Status: DC

## 2024-02-15 NOTE — Telephone Encounter (Signed)
 Pt called for a refill of Zoloft.  He has not been seen since 2023.  Almira Coaster said she would see him.  He said he has 2 pills left and he doesn't want to go into withdrawals.  He is going to check with his insurance to see if he can do a virtual appt and if so, he can move his appt up to an earlier date.  But he is asking for refill until he can be seen for the in person appt he scheduled today for 5/16, which is Gina's first available.   Pls [facility] to [facility] [address]

## 2024-02-15 NOTE — Telephone Encounter (Signed)
 Rx sent after Almira Coaster approved.

## 2024-04-08 ENCOUNTER — Other Ambulatory Visit: Payer: Self-pay | Admitting: Adult Health

## 2024-04-08 DIAGNOSIS — F422 Mixed obsessional thoughts and acts: Secondary | ICD-10-CM

## 2024-04-08 DIAGNOSIS — F411 Generalized anxiety disorder: Secondary | ICD-10-CM

## 2024-04-12 ENCOUNTER — Ambulatory Visit: Admitting: Adult Health

## 2024-04-12 ENCOUNTER — Encounter: Payer: Self-pay | Admitting: Adult Health

## 2024-04-12 DIAGNOSIS — F411 Generalized anxiety disorder: Secondary | ICD-10-CM | POA: Diagnosis not present

## 2024-04-12 DIAGNOSIS — F422 Mixed obsessional thoughts and acts: Secondary | ICD-10-CM

## 2024-04-12 DIAGNOSIS — F341 Dysthymic disorder: Secondary | ICD-10-CM | POA: Diagnosis not present

## 2024-04-12 NOTE — Progress Notes (Signed)
 Alexander Hayes [id number] 04/20/1991 32 y.o.  Subjective:   Patient ID:  Alexander Hayes is a 33 y.o. (DOB 1991/05/05) male.  Chief Complaint: No chief complaint on file.   HPI Alexander Hayes presents to the office today for follow-up of mixed obsessional thoughts, GAD, and depression.  Describes mood today as "ok". Pleasant. Mood symptoms - denies anxiety, depression and irritability. Reports stable interest and motivation. Denies panic attacks. Denies worry, rumination and over thinking. Mood is consistent. "I feel like I'm doing good". Feels like the Zoloft  continues to work well - has decreased dose to 100mg  daily. Taking medications as prescribed. Energy levels stable. Active, has a regular exercise routine. Going to the gym. Enjoys some usual interests and activities. Single. Lives alone. No pets. Parents local. Extended family local. Spending time with family and friends. Appetite adequate. Weight stable 195 pounds. Sleeps well most nights. Averages 6 to 7 hours. Focus and concentration stable. Completing tasks. Managing aspects of household. Works at [employer]. Denies SI or HI.  Denies AH or VH. Denies self harm. Denies substance use.   Previous medication trials: Fluvoxamine   Review of Systems:  Review of Systems  Musculoskeletal:  Negative for gait problem.  Neurological:  Negative for tremors.  Psychiatric/Behavioral:         Please refer to HPI    Medications: I have reviewed the patient's current medications.  Current Outpatient Medications  Medication Sig Dispense Refill   sertraline  (ZOLOFT ) 100 MG tablet Take 2 tablets (200 mg total) by mouth daily after breakfast. 120 tablet 0   No current facility-administered medications for this visit.    Medication Side Effects: None  Allergies: No Known Allergies  Past Medical History:  Diagnosis Date   Anxiety    Deafness congenital    Depression    Obsessive-compulsive disorder     Past Medical  History, Surgical history, Social history, and Family history were reviewed and updated as appropriate.   Please see review of systems for further details on the patient's review from today.   Objective:   Physical Exam:  There were no vitals taken for this visit.  Physical Exam Constitutional:      General: He is not in acute distress. Musculoskeletal:        General: No deformity.  Neurological:     Mental Status: He is alert and oriented to person, place, and time.     Coordination: Coordination normal.  Psychiatric:        Attention and Perception: Attention and perception normal. He does not perceive auditory or visual hallucinations.        Mood and Affect: Mood normal. Mood is not anxious or depressed. Affect is not labile, blunt, angry or inappropriate.        Speech: Speech normal.        Behavior: Behavior normal.        Thought Content: Thought content normal. Thought content is not paranoid or delusional. Thought content does not include homicidal or suicidal ideation. Thought content does not include homicidal or suicidal plan.        Cognition and Memory: Cognition and memory normal.        Judgment: Judgment normal.     Comments: Insight intact     Lab Review:  No results found for: "NA", "K", "CL", "CO2", "GLUCOSE", "BUN", "CREATININE", "CALCIUM", "PROT", "ALBUMIN", "AST", "ALT", "ALKPHOS", "BILITOT", "GFRNONAA", "GFRAA"  No results found for: "WBC", "RBC", "HGB", "HCT", "PLT", "MCV", "MCH", "MCHC", "RDW", "LYMPHSABS", "  MONOABS", "EOSABS", "BASOSABS"  No results found for: "POCLITH", "LITHIUM"   No results found for: "PHENYTOIN", "PHENOBARB", "VALPROATE", "CBMZ"   .res Assessment: Plan:    Plan:  PDMP reviewed  1. Zoloft  100mg  daily   Continue therapy  RTC 6 months  15 minutes spent dedicated to the care of this patient on the date of this encounter to include pre-visit review of records, ordering of medication, post visit documentation, and  face-to-face time with the patient discussing mixed obsessional thoughts, GAD, and depression. Discussed continuing current medication regimen.  Patient advised to contact office with any questions, adverse effects, or acute worsening in signs and symptoms.   There are no diagnoses linked to this encounter.   Please see After Visit Summary for patient specific instructions.  Future Appointments  Date Time Provider Department Center  04/12/2024  4:30 PM Alexander Lapaglia Nattalie, NP CP-CP None    No orders of the defined types were placed in this encounter.   -------------------------------

## 2024-07-02 ENCOUNTER — Telehealth: Payer: Self-pay | Admitting: Adult Health

## 2024-07-02 DIAGNOSIS — F422 Mixed obsessional thoughts and acts: Secondary | ICD-10-CM

## 2024-07-02 DIAGNOSIS — F411 Generalized anxiety disorder: Secondary | ICD-10-CM

## 2024-07-02 MED ORDER — SERTRALINE HCL 100 MG PO TABS: 200.0000 mg | ORAL_TABLET | Freq: Every day | ORAL | 0 refills | Status: DC

## 2024-07-02 NOTE — Telephone Encounter (Signed)
 Pt called at 9:30a requesting refill of Zoloft  to    [id number] [place], [place] - [address] [place] [address] Phone: [phone]  Fax: [phone]   Next appt 11/17

## 2024-07-02 NOTE — Telephone Encounter (Signed)
 Sent!

## 2024-10-14 ENCOUNTER — Encounter: Payer: Self-pay | Admitting: Adult Health

## 2024-10-14 ENCOUNTER — Ambulatory Visit: Admitting: Adult Health

## 2024-10-14 DIAGNOSIS — F341 Dysthymic disorder: Secondary | ICD-10-CM

## 2024-10-14 DIAGNOSIS — F422 Mixed obsessional thoughts and acts: Secondary | ICD-10-CM | POA: Diagnosis not present

## 2024-10-14 DIAGNOSIS — F411 Generalized anxiety disorder: Secondary | ICD-10-CM

## 2024-10-14 MED ORDER — SERTRALINE HCL 100 MG PO TABS: 100.0000 mg | ORAL_TABLET | Freq: Every day | ORAL | 1 refills | Status: AC

## 2024-10-14 NOTE — Progress Notes (Signed)
 Alexander Hayes [id number] Oct 13, 1991 33 y.o.  Subjective:   Patient ID:  Alexander Hayes is a 33 y.o. (DOB 09/18/1991) male.  Chief Complaint: No chief complaint on file.   HPI Zacherie Honeyman Marion presents to the office today for follow-up of mixed obsessional thoughts, GAD, and depression.  Describes mood today as ok. Pleasant. Mood symptoms - reports some anxiety, depression and irritability. Reports stable interest and motivation. Denies panic attacks. Denies worry, rumination and over thinking. Reports mood is stable. Stating I feel like I'm doing good. Feels like the Zoloft  continues to work well - taking 100mg  daily. Taking medications as prescribed. Energy levels stable. Active, has a regular exercise routine. Going to the gym. Enjoys some usual interests and activities. Single. Lives alone. Has a dog. Parents local. Extended family local. Spending time with family and friends. Appetite adequate. Weight loss 204 from 221 pounds - 70 Sleeps well most nights. Averages 7 to 8 hours. Focus and concentration stable. Completing tasks. Managing aspects of household. Works full time [employer]. Denies SI or HI.  Denies AH or VH. Denies self harm. Denies substance use.   Previous medication trials: Fluvoxamine    Review of Systems:  Review of Systems  Musculoskeletal:  Negative for gait problem.  Neurological:  Negative for tremors.  Psychiatric/Behavioral:         Please refer to HPI    Medications: I have reviewed the patient's current medications.  Current Outpatient Medications  Medication Sig Dispense Refill   sertraline  (ZOLOFT ) 100 MG tablet Take 2 tablets (200 mg total) by mouth daily after breakfast. 180 tablet 0   No current facility-administered medications for this visit.    Medication Side Effects: None  Allergies: No Known Allergies  Past Medical History:  Diagnosis Date   Anxiety    Deafness congenital    Depression    Obsessive-compulsive disorder      Past Medical History, Surgical history, Social history, and Family history were reviewed and updated as appropriate.   Please see review of systems for further details on the patient's review from today.   Objective:   Physical Exam:  There were no vitals taken for this visit.  Physical Exam Constitutional:      General: He is not in acute distress. Musculoskeletal:        General: No deformity.  Neurological:     Mental Status: He is alert and oriented to person, place, and time.     Coordination: Coordination normal.  Psychiatric:        Attention and Perception: Attention and perception normal. He does not perceive auditory or visual hallucinations.        Mood and Affect: Mood normal. Mood is not anxious or depressed. Affect is not labile, blunt, angry or inappropriate.        Speech: Speech normal.        Behavior: Behavior normal.        Thought Content: Thought content normal. Thought content is not paranoid or delusional. Thought content does not include homicidal or suicidal ideation. Thought content does not include homicidal or suicidal plan.        Cognition and Memory: Cognition and memory normal.        Judgment: Judgment normal.     Comments: Insight intact     Lab Review:  No results found for: NA, K, CL, CO2, GLUCOSE, BUN, CREATININE, CALCIUM, PROT, ALBUMIN, AST, ALT, ALKPHOS, BILITOT, GFRNONAA, GFRAA  No results found for: WBC, RBC, HGB,  HCT, PLT, MCV, MCH, MCHC, RDW, LYMPHSABS, MONOABS, EOSABS, BASOSABS  No results found for: POCLITH, LITHIUM   No results found for: PHENYTOIN, PHENOBARB, VALPROATE, CBMZ   .res Assessment: Plan:    Plan:  PDMP reviewed  1. Zoloft  100mg  daily   Continue therapy  RTC 6 months  15 minutes spent dedicated to the care of this patient on the date of this encounter to include pre-visit review of records, ordering of medication, post visit  documentation, and face-to-face time with the patient discussing mixed obsessional thoughts, GAD, and depression. Discussed continuing current medication regimen.  Patient advised to contact office with any questions, adverse effects, or acute worsening in signs and symptoms.  There are no diagnoses linked to this encounter.   Please see After Visit Summary for patient specific instructions.  Future Appointments  Date Time Provider Department Center  10/14/2024  4:30 PM Zayd Bonet Nattalie, NP [facility] None    No orders of the defined types were placed in this encounter.   -------------------------------

## 2025-04-14 ENCOUNTER — Ambulatory Visit: Admitting: Adult Health
